# Patient Record
Sex: Female | Born: 1955 | Race: White | Hispanic: No | Marital: Married | Smoking: Current every day smoker
Health system: Southern US, Community
[De-identification: ages and names within clinical notes are randomized; demographics above are authoritative.]

## PROBLEM LIST (undated history)

## (undated) DIAGNOSIS — E079 Disorder of thyroid, unspecified: Secondary | ICD-10-CM

## (undated) DIAGNOSIS — G43909 Migraine, unspecified, not intractable, without status migrainosus: Secondary | ICD-10-CM

## (undated) DIAGNOSIS — M549 Dorsalgia, unspecified: Secondary | ICD-10-CM

## (undated) DIAGNOSIS — F4310 Post-traumatic stress disorder, unspecified: Secondary | ICD-10-CM

## (undated) DIAGNOSIS — F32A Depression, unspecified: Secondary | ICD-10-CM

## (undated) DIAGNOSIS — F329 Major depressive disorder, single episode, unspecified: Secondary | ICD-10-CM

## (undated) DIAGNOSIS — G8929 Other chronic pain: Secondary | ICD-10-CM

## (undated) HISTORY — PX: CATARACT EXTRACTION: SUR2

## (undated) HISTORY — PX: ABDOMINAL HYSTERECTOMY: SHX81

---

## 1999-04-11 ENCOUNTER — Encounter: Payer: Self-pay | Admitting: Obstetrics and Gynecology

## 1999-04-11 ENCOUNTER — Other Ambulatory Visit: Admission: RE | Admit: 1999-04-11 | Discharge: 1999-04-11 | Payer: Self-pay | Admitting: Obstetrics and Gynecology

## 1999-04-11 ENCOUNTER — Ambulatory Visit (HOSPITAL_COMMUNITY): Admission: RE | Admit: 1999-04-11 | Discharge: 1999-04-11 | Payer: Self-pay | Admitting: Obstetrics and Gynecology

## 1999-06-06 ENCOUNTER — Encounter: Payer: Self-pay | Admitting: Obstetrics & Gynecology

## 1999-06-06 ENCOUNTER — Ambulatory Visit (HOSPITAL_COMMUNITY): Admission: RE | Admit: 1999-06-06 | Discharge: 1999-06-06 | Payer: Self-pay | Admitting: Obstetrics & Gynecology

## 2000-04-04 ENCOUNTER — Emergency Department (HOSPITAL_COMMUNITY): Admission: EM | Admit: 2000-04-04 | Discharge: 2000-04-04 | Payer: Self-pay | Admitting: Emergency Medicine

## 2001-03-01 ENCOUNTER — Other Ambulatory Visit: Admission: RE | Admit: 2001-03-01 | Discharge: 2001-03-01 | Payer: Self-pay | Admitting: Obstetrics and Gynecology

## 2001-03-01 ENCOUNTER — Encounter: Payer: Self-pay | Admitting: Obstetrics and Gynecology

## 2001-03-01 ENCOUNTER — Ambulatory Visit (HOSPITAL_COMMUNITY)
Admission: RE | Admit: 2001-03-01 | Discharge: 2001-03-01 | Payer: Self-pay | Admitting: Physical Medicine & Rehabilitation

## 2003-02-22 ENCOUNTER — Ambulatory Visit (HOSPITAL_COMMUNITY): Admission: RE | Admit: 2003-02-22 | Discharge: 2003-02-22 | Payer: Self-pay | Admitting: Gastroenterology

## 2003-03-08 ENCOUNTER — Ambulatory Visit (HOSPITAL_COMMUNITY): Admission: RE | Admit: 2003-03-08 | Discharge: 2003-03-08 | Payer: Self-pay | Admitting: Obstetrics and Gynecology

## 2003-03-08 ENCOUNTER — Encounter: Payer: Self-pay | Admitting: Obstetrics and Gynecology

## 2003-03-09 ENCOUNTER — Encounter: Payer: Self-pay | Admitting: Obstetrics and Gynecology

## 2003-03-09 ENCOUNTER — Ambulatory Visit (HOSPITAL_COMMUNITY): Admission: RE | Admit: 2003-03-09 | Discharge: 2003-03-09 | Payer: Self-pay | Admitting: Obstetrics and Gynecology

## 2003-06-28 ENCOUNTER — Ambulatory Visit (HOSPITAL_COMMUNITY): Admission: RE | Admit: 2003-06-28 | Discharge: 2003-06-28 | Payer: Self-pay | Admitting: Obstetrics and Gynecology

## 2004-03-07 ENCOUNTER — Other Ambulatory Visit: Admission: RE | Admit: 2004-03-07 | Discharge: 2004-03-07 | Payer: Self-pay | Admitting: Obstetrics and Gynecology

## 2004-04-18 ENCOUNTER — Ambulatory Visit (HOSPITAL_COMMUNITY): Admission: RE | Admit: 2004-04-18 | Discharge: 2004-04-18 | Payer: Self-pay | Admitting: Obstetrics and Gynecology

## 2004-11-18 ENCOUNTER — Encounter: Admission: RE | Admit: 2004-11-18 | Discharge: 2004-11-18 | Payer: Self-pay | Admitting: Family Medicine

## 2004-11-20 ENCOUNTER — Encounter: Admission: RE | Admit: 2004-11-20 | Discharge: 2004-11-20 | Payer: Self-pay | Admitting: Family Medicine

## 2004-12-13 ENCOUNTER — Encounter: Admission: RE | Admit: 2004-12-13 | Discharge: 2004-12-13 | Payer: Self-pay | Admitting: Family Medicine

## 2005-03-11 ENCOUNTER — Other Ambulatory Visit: Admission: RE | Admit: 2005-03-11 | Discharge: 2005-03-11 | Payer: Self-pay | Admitting: Obstetrics and Gynecology

## 2005-10-29 ENCOUNTER — Ambulatory Visit (HOSPITAL_COMMUNITY): Admission: RE | Admit: 2005-10-29 | Discharge: 2005-10-29 | Payer: Self-pay | Admitting: Obstetrics and Gynecology

## 2008-09-20 ENCOUNTER — Ambulatory Visit (HOSPITAL_COMMUNITY): Admission: RE | Admit: 2008-09-20 | Discharge: 2008-09-20 | Payer: Self-pay | Admitting: Family Medicine

## 2009-05-13 ENCOUNTER — Emergency Department (HOSPITAL_COMMUNITY): Admission: EM | Admit: 2009-05-13 | Discharge: 2009-05-13 | Payer: Self-pay | Admitting: Emergency Medicine

## 2009-05-28 ENCOUNTER — Encounter: Admission: RE | Admit: 2009-05-28 | Discharge: 2009-05-28 | Payer: Self-pay | Admitting: Family Medicine

## 2009-11-08 ENCOUNTER — Other Ambulatory Visit: Admission: RE | Admit: 2009-11-08 | Discharge: 2009-11-08 | Payer: Self-pay | Admitting: Obstetrics and Gynecology

## 2011-01-10 NOTE — Op Note (Signed)
Kelli Vasquez, Kelli Vasquez                        ACCOUNT NO.:  [id number]   MEDICAL RECORD NO.:  [id number]                   PATIENT TYPE:  AMB   LOCATION:  ENDO                                 FACILITY:  [facility]   PHYSICIAN:  Anselmo Rod, M.D.               DATE OF BIRTH:  1956/05/20   DATE OF PROCEDURE:  02/22/2003  DATE OF DISCHARGE:                                 OPERATIVE REPORT   PROCEDURE:  Screening colonoscopy.   ENDOSCOPIST:  Anselmo Rod, M.D.   INSTRUMENT USED:  Pediatric adjustable Olympus colonoscope changing to an  adult scope.   INDICATIONS FOR PROCEDURE:  The patient is a 55 year old white female with a  history of guaiac positive stool and a questionable family history of colon  cancer in her mother.  Rule out colon polyps, masses, etc.   PRE-PROCEDURE PREPARATION:  Informed consent was procured from the patient.  The patient fasted for eight hours prior to the procedure and prepped with a  bottle of magnesium citrate and a gallon of GoLYTELY and the patient was  given 400 mg of Cipro prophylactically for MVP the night prior to the  procedure. There are known allergies to erythromycin and amoxicillin.   PRE-PROCEDURE PHYSICAL:  VITAL SIGNS:  The patient had stable vital signs.  NECK:  Supple.  CHEST:  Clear to auscultation.  CARDIAC:  S1 and S2 regular.  ABDOMEN:  Abdomen is soft with normal bowel sounds.   DESCRIPTION OF PROCEDURE:  The patient was placed in the left lateral  decubitus position and sedated with 125 mg of Demerol and 12 mg of Versed  intravenously.  Once the patient was adequately sedated and maintained on  low flow oxygen and continuous cardiac monitoring the Olympus video  colonoscope was advanced from the rectum to the cecum with difficulty.  The  pediatric colonoscope had to be withdrawn around the mid transverse colon  because of a very tortuous colon associated with severe visceral  hypersensitivity.  Note on insufflation of the  air into the colon no masses,  polyps, erosions, ulcerations or diverticula were noted.  The appendiceal  orifice and ileocecal valve were visualized and photographed.  The TI was  not visualized.  Small internal hemorrhoids were seen on retroflexion in the  rectum.  The patient tolerated the procedure well without complications.   IMPRESSION:  1. Small, non-bleeding internal hemorrhoids.  2. No masses or polyps seen.  3. Visualization was poor requiring the patient's position to be changed     from left lateral to supine and right lateral position and changing the     pediatric scope to an adult scope.   RECOMMENDATIONS:  1. High fiber diet with liberal fluid intake has been recommended.  2. A 20 to 25 gram fiber diet has been recommended along with liberal fluid     intake.  3.     Repeat colorectal cancer  screening is recommended at the age of 69 unless     the patient develops any abnormal symptoms.  4. Outpatient follow-up in the next two weeks or earlier if need be.                                               Anselmo Rod, M.D.    JNM/MEDQ  D:  02/22/2003  T:  02/22/2003  Job:  [id number]   cc:   Artist Pais, M.D.  [address], [address]  [place]  [place] [address]  Fax: [phone]   Stacie Acres. White, M.D.  [address]  [place]  [place] [address]  Fax: [phone]

## 2011-05-20 ENCOUNTER — Other Ambulatory Visit (HOSPITAL_COMMUNITY): Payer: Self-pay | Admitting: Family Medicine

## 2011-05-20 DIAGNOSIS — Z1231 Encounter for screening mammogram for malignant neoplasm of breast: Secondary | ICD-10-CM

## 2011-05-30 ENCOUNTER — Ambulatory Visit (HOSPITAL_COMMUNITY): Payer: Self-pay

## 2011-06-06 ENCOUNTER — Ambulatory Visit (HOSPITAL_COMMUNITY): Payer: Self-pay | Attending: Family Medicine

## 2012-09-15 ENCOUNTER — Emergency Department (HOSPITAL_COMMUNITY): Payer: Medicare Other

## 2012-09-15 ENCOUNTER — Emergency Department (HOSPITAL_COMMUNITY)
Admission: EM | Admit: 2012-09-15 | Discharge: 2012-09-16 | Disposition: A | Discharge status: Home / Self Care | Payer: Medicare Other | Attending: Emergency Medicine | Admitting: Emergency Medicine

## 2012-09-15 ENCOUNTER — Encounter (HOSPITAL_COMMUNITY): Payer: Self-pay | Admitting: Emergency Medicine

## 2012-09-15 DIAGNOSIS — F4310 Post-traumatic stress disorder, unspecified: Secondary | ICD-10-CM | POA: Insufficient documentation

## 2012-09-15 DIAGNOSIS — F329 Major depressive disorder, single episode, unspecified: Secondary | ICD-10-CM | POA: Insufficient documentation

## 2012-09-15 DIAGNOSIS — R1012 Left upper quadrant pain: Secondary | ICD-10-CM | POA: Insufficient documentation

## 2012-09-15 DIAGNOSIS — F17200 Nicotine dependence, unspecified, uncomplicated: Secondary | ICD-10-CM | POA: Insufficient documentation

## 2012-09-15 DIAGNOSIS — Z79899 Other long term (current) drug therapy: Secondary | ICD-10-CM | POA: Insufficient documentation

## 2012-09-15 DIAGNOSIS — K8689 Other specified diseases of pancreas: Secondary | ICD-10-CM | POA: Insufficient documentation

## 2012-09-15 DIAGNOSIS — R110 Nausea: Secondary | ICD-10-CM | POA: Insufficient documentation

## 2012-09-15 DIAGNOSIS — R109 Unspecified abdominal pain: Secondary | ICD-10-CM

## 2012-09-15 DIAGNOSIS — J029 Acute pharyngitis, unspecified: Secondary | ICD-10-CM | POA: Insufficient documentation

## 2012-09-15 DIAGNOSIS — G43909 Migraine, unspecified, not intractable, without status migrainosus: Secondary | ICD-10-CM | POA: Insufficient documentation

## 2012-09-15 DIAGNOSIS — J3489 Other specified disorders of nose and nasal sinuses: Secondary | ICD-10-CM | POA: Insufficient documentation

## 2012-09-15 DIAGNOSIS — Z8659 Personal history of other mental and behavioral disorders: Secondary | ICD-10-CM | POA: Insufficient documentation

## 2012-09-15 DIAGNOSIS — F3289 Other specified depressive episodes: Secondary | ICD-10-CM | POA: Insufficient documentation

## 2012-09-15 DIAGNOSIS — E079 Disorder of thyroid, unspecified: Secondary | ICD-10-CM | POA: Insufficient documentation

## 2012-09-15 HISTORY — DX: Post-traumatic stress disorder, unspecified: F43.10

## 2012-09-15 HISTORY — DX: Disorder of thyroid, unspecified: E07.9

## 2012-09-15 HISTORY — DX: Migraine, unspecified, not intractable, without status migrainosus: G43.909

## 2012-09-15 HISTORY — DX: Major depressive disorder, single episode, unspecified: F32.9

## 2012-09-15 HISTORY — DX: Depression, unspecified: F32.A

## 2012-09-15 LAB — CBC WITH DIFFERENTIAL/PLATELET
Basophils Absolute: 0 10*3/uL (ref 0.0–0.1)
Eosinophils Relative: 1 % (ref 0–5)
HCT: 35.1 % — ABNORMAL LOW (ref 36.0–46.0)
Hemoglobin: 11.7 g/dL — ABNORMAL LOW (ref 12.0–15.0)
MCH: 32.2 pg (ref 26.0–34.0)
MCHC: 33.3 g/dL (ref 30.0–36.0)
MCV: 96.7 fL (ref 78.0–100.0)
WBC: 7 10*3/uL (ref 4.0–10.5)

## 2012-09-15 LAB — COMPREHENSIVE METABOLIC PANEL
ALT: 34 U/L (ref 0–35)
AST: 29 U/L (ref 0–37)
BUN: 13 mg/dL (ref 6–23)
CO2: 29 mEq/L (ref 19–32)
Chloride: 100 mEq/L (ref 96–112)
Creatinine, Ser: 0.79 mg/dL (ref 0.50–1.10)
GFR calc Af Amer: >90 mL/min (ref >90)
Glucose, Bld: 116 mg/dL — ABNORMAL HIGH (ref 70–99)
Potassium: 3.5 mEq/L (ref 3.5–5.1)
Sodium: 138 mEq/L (ref 135–145)
Total Bilirubin: 0.2 mg/dL — ABNORMAL LOW (ref 0.3–1.2)

## 2012-09-15 LAB — POCT I-STAT TROPONIN I: Troponin i, poc: 0 ng/mL (ref 0.00–0.08)

## 2012-09-15 MED ORDER — MORPHINE SULFATE 4 MG/ML IJ SOLN
4.0000 mg | Freq: Once | INTRAMUSCULAR | Status: AC
  Administered 2012-09-15: 4 mg via INTRAVENOUS
  Filled 2012-09-15: qty 1

## 2012-09-15 MED ORDER — ONDANSETRON HCL 4 MG/2ML IJ SOLN
4.0000 mg | Freq: Once | INTRAMUSCULAR | Status: AC
  Administered 2012-09-16: 4 mg via INTRAVENOUS
  Filled 2012-09-15: qty 2

## 2012-09-15 MED ORDER — ONDANSETRON HCL 4 MG/2ML IJ SOLN
4.0000 mg | Freq: Once | INTRAMUSCULAR | Status: AC
  Administered 2012-09-15: 4 mg via INTRAVENOUS
  Filled 2012-09-15: qty 2

## 2012-09-15 MED ORDER — IOHEXOL 300 MG/ML  SOLN
50.0000 mL | Freq: Once | INTRAMUSCULAR | Status: AC | PRN
  Administered 2012-09-15: 50 mL via ORAL

## 2012-09-15 NOTE — ED Provider Notes (Signed)
57 year old female has been treated by her PCP for bronchitis with a Z-Pak. Today, she had an episode where she felt like she couldn't breathe on her left side. She's also been having left-sided abdominal pain. There is no nausea vomiting. She's not had any fever or chills. On exam, lungs are completely clear. Abdomen has moderate tenderness throughout the left side of the abdomen. Chest x-ray is unremarkable. CT of the abdomen is pending.  I saw and evaluated the patient, reviewed the resident's note and I agree with the findings and plan.   Dione Booze, MD 09/15/12 2306

## 2012-09-15 NOTE — ED Provider Notes (Signed)
History     CSN: [id number]  Arrival date & time 09/15/12  2003   First MD Initiated Contact with Patient 09/15/12 2141      Chief Complaint  Patient presents with  . Shortness of Breath    (Consider location/radiation/quality/duration/timing/severity/associated sxs/prior treatment) Patient is a 57 y.o. female presenting with URI and abdominal pain. The history is provided by the patient.  URI The primary symptoms include sore throat, abdominal pain and nausea. Primary symptoms do not include fever, fatigue, headaches, wheezing, vomiting, arthralgias or rash. The current episode started more than 1 week ago (2 months ago). This is a new problem. The problem has been gradually worsening.  The onset of the illness is associated with exposure to sick contacts. Symptoms associated with the illness include facial pain, sinus pressure, congestion and rhinorrhea.  Abdominal Pain The primary symptoms of the illness include abdominal pain and nausea. The primary symptoms of the illness do not include fever, fatigue, shortness of breath, vomiting or diarrhea. The current episode started more than 2 days ago. The onset of the illness was gradual. The problem has been gradually worsening.  The abdominal pain began more than 2 days ago. The pain came on gradually. The abdominal pain has been unchanged since its onset. The abdominal pain is located in the LUQ.  The patient has not had a change in bowel habit. Symptoms associated with the illness do not include urgency, frequency or back pain.    Past Medical History  Diagnosis Date  . Thyroid disease   . Depression   . PTSD (post-traumatic stress disorder)   . Migraine     History reviewed. No pertinent past surgical history.  History reviewed. No pertinent family history.  History  Substance Use Topics  . Smoking status: Current Every Day Smoker -- 0.5 packs/day  . Smokeless tobacco: Not on file  . Alcohol Use: Yes     Comment: Rarely     OB History    Grav Para Term Preterm Abortions TAB SAB Ect Mult Living                  Review of Systems  Constitutional: Negative for fever and fatigue.  HENT: Positive for congestion, sore throat, rhinorrhea and sinus pressure. Negative for postnasal drip.   Eyes: Negative for photophobia and visual disturbance.  Respiratory: Negative for chest tightness, shortness of breath and wheezing.   Cardiovascular: Negative for chest pain, palpitations and leg swelling.  Gastrointestinal: Positive for nausea and abdominal pain. Negative for vomiting and diarrhea.  Genitourinary: Negative for urgency, frequency and difficulty urinating.  Musculoskeletal: Negative for back pain and arthralgias.  Skin: Negative for rash and wound.  Neurological: Negative for weakness and headaches.  Psychiatric/Behavioral: Negative for confusion and agitation.    Allergies  Amoxicillin; Codeine; and Erythromycin  Home Medications   Current Outpatient Rx  Name  Route  Sig  Dispense  Refill  . CYCLOBENZAPRINE HCL 10 MG PO TABS   Oral   Take 10 mg by mouth 3 (three) times daily as needed. For pain         . LEVOTHYROXINE SODIUM 137 MCG PO TABS   Oral   Take 137 mcg by mouth daily.         Marland Kitchen LORAZEPAM 1 MG PO TABS   Oral   Take 1 mg by mouth 4 (four) times daily as needed. For anxiety         . OMEPRAZOLE 40 MG PO CPDR  Oral   Take 40 mg by mouth daily.         . OXYCODONE-ACETAMINOPHEN 10-325 MG PO TABS   Oral   Take 1 tablet by mouth every 6 (six) hours as needed. For pain         . PROMETHAZINE HCL 25 MG PO TABS   Oral   Take 25 mg by mouth every 6 (six) hours as needed. For nausea         . SIMVASTATIN 40 MG PO TABS   Oral   Take 40 mg by mouth every evening.         . TRAZODONE HCL 50 MG PO TABS   Oral   Take 50-100 mg by mouth at bedtime and may repeat dose one time if needed. For insomnia           BP 100/41  Pulse 65  Temp 98.4 F (36.9 C) (Oral)   Resp 16  SpO2 97%  Physical Exam  Nursing note and vitals reviewed. Constitutional: She is oriented to person, place, and time. She appears well-developed and well-nourished. No distress.  HENT:  Head: Normocephalic and atraumatic.  Mouth/Throat: Oropharynx is clear and moist.  Eyes: EOM are normal. Pupils are equal, round, and reactive to light.  Neck: Normal range of motion. Neck supple.  Cardiovascular: Normal rate, regular rhythm, normal heart sounds and intact distal pulses.   Pulmonary/Chest: Effort normal and breath sounds normal. She has no wheezes. She has no rales.  Abdominal: Soft. Bowel sounds are normal. She exhibits no distension. There is tenderness. There is no rebound and no guarding.       Mild left upper quadrant tenderness. No sign of peritonitis  Musculoskeletal: Normal range of motion. She exhibits no edema and no tenderness.  Lymphadenopathy:    She has no cervical adenopathy.  Neurological: She is alert and oriented to person, place, and time. She displays normal reflexes. No cranial nerve deficit. She exhibits normal muscle tone. Coordination normal.  Skin: Skin is warm and dry. No rash noted.  Psychiatric: She has a normal mood and affect. Her behavior is normal.    ED Course  Procedures (including critical care time)  Labs Reviewed  CBC WITH DIFFERENTIAL - Abnormal; Notable for the following:    RBC 3.63 (*)     Hemoglobin 11.7 (*)     HCT 35.1 (*)     All other components within normal limits  COMPREHENSIVE METABOLIC PANEL - Abnormal; Notable for the following:    Glucose, Bld 116 (*)     Total Bilirubin 0.2 (*)     All other components within normal limits  URINALYSIS, ROUTINE W REFLEX MICROSCOPIC - Abnormal; Notable for the following:    Leukocytes, UA SMALL (*)     All other components within normal limits  LIPASE, BLOOD  POCT I-STAT TROPONIN I  URINE MICROSCOPIC-ADD ON  URINE CULTURE   Dg Chest 2 View  09/15/2012  *RADIOLOGY REPORT*   Clinical Data: Shortness of breath.  Cough.  CHEST - 2 VIEW  Comparison: None.  Findings: Heart size and pulmonary vascularity are normal and the lungs are clear except for slight peribronchial thickening consistent with bronchitis.  Multiple calcified granulomas in the right lung and in the right hilum.  No effusions.  No osseous abnormality.  IMPRESSION: Slight bronchitic changes.   Original Report Authenticated By: Francene Boyers, M.D.      No diagnosis found.    MDM  54F here with 2-3 months  of upper respiratory congestion, cough, and left upper quadrant abdominal pain. She reports an unexplained 19 lb weight loss along with these symptoms. She was seen by her pcp about 1 week ago and was put on azithromycin for bronchitis and is going to schedule a colonoscopy soon. Exam as noted above. Does have some nasal congestion. Abdomen mildly tender LUQ. No peritonitis. CXR c/w bronchitis. Labs unremarkable except mild anemia with Hgb 11.7 do not have previous to compare. Will obtain CT abd/pelvis to r/o possible malignancy or serious infection. Given Morphine and Zofran for symptomatic control.  1:24 AM Pt currently in CT. Pt care transferred to Dr. Hyacinth Meeker pending results. If CT scan negative for acute pathology, pt will f/u with pcp for further management of weight loss, pain, and URI symptoms.      Johnnette Gourd, MD 09/16/12 [phone]

## 2012-09-15 NOTE — ED Notes (Addendum)
Patient reports shortness of breath over the last two weeks and left sided abdominal pain.  Placed on Z-Pak 10 days ago for bronchitis; Patient reports that she has finished taking entire prescription and does not feel any better.  Shortness of breath worsened tonight.  Denies chest pain.

## 2012-09-16 ENCOUNTER — Emergency Department (HOSPITAL_COMMUNITY): Payer: Medicare Other

## 2012-09-16 LAB — URINALYSIS, ROUTINE W REFLEX MICROSCOPIC
Glucose, UA: NEGATIVE mg/dL
Ketones, ur: NEGATIVE mg/dL
Nitrite: NEGATIVE
Protein, ur: NEGATIVE mg/dL
Urobilinogen, UA: 0.2 mg/dL (ref 0.0–1.0)

## 2012-09-16 LAB — URINE MICROSCOPIC-ADD ON

## 2012-09-16 MED ORDER — OXYCODONE-ACETAMINOPHEN 5-325 MG PO TABS
2.0000 | ORAL_TABLET | Freq: Once | ORAL | Status: AC
  Administered 2012-09-16: 2 via ORAL
  Filled 2012-09-16: qty 2

## 2012-09-16 MED ORDER — OXYCODONE-ACETAMINOPHEN 5-325 MG PO TABS: 1.0000 | ORAL_TABLET | ORAL | Status: DC | PRN

## 2012-09-16 MED ORDER — ONDANSETRON HCL 4 MG/2ML IJ SOLN
4.0000 mg | Freq: Once | INTRAMUSCULAR | Status: AC
  Administered 2012-09-16: 4 mg via INTRAVENOUS
  Filled 2012-09-16: qty 2

## 2012-09-16 MED ORDER — IOHEXOL 300 MG/ML  SOLN
100.0000 mL | Freq: Once | INTRAMUSCULAR | Status: AC | PRN
  Administered 2012-09-16: 80 mL via INTRAVENOUS

## 2012-09-16 NOTE — ED Notes (Signed)
Pt comfortable with discharge instructions and verbalized understanding of follow-up care

## 2012-09-16 NOTE — ED Notes (Addendum)
Pt returned from CT. Continues to c/o nausea, EDP aware.

## 2012-09-16 NOTE — ED Provider Notes (Signed)
  Physical Exam  BP 93/61  Pulse 61  Temp 98.1 F (36.7 C) (Oral)  Resp 16  SpO2 96%  Physical Exam  ED Course  Procedures  MDM Patient reexamined, soft abdomen, minimal tenderness, labs reviewed, CT scan reviewed, patient has expressed her understanding and will followup with her family Dr. I have given her a printout of all of her results so that she can followup this week. She has expressed her understanding of all of her diagnosis based on CT scan results. At this time she appears stable for discharge.      Vida Roller, MD 09/16/12 [phone]

## 2012-09-16 NOTE — ED Notes (Signed)
Pt transported to CT ?

## 2012-09-17 LAB — URINE CULTURE
Colony Count: NO GROWTH
Culture: NO GROWTH

## 2013-06-13 ENCOUNTER — Emergency Department (HOSPITAL_COMMUNITY)
Admission: EM | Admit: 2013-06-13 | Discharge: 2013-06-14 | Disposition: A | Discharge status: Home / Self Care | Payer: Medicare Other | Attending: Emergency Medicine | Admitting: Emergency Medicine

## 2013-06-13 ENCOUNTER — Encounter (HOSPITAL_COMMUNITY): Payer: Self-pay | Admitting: Emergency Medicine

## 2013-06-13 DIAGNOSIS — F17200 Nicotine dependence, unspecified, uncomplicated: Secondary | ICD-10-CM | POA: Insufficient documentation

## 2013-06-13 DIAGNOSIS — M545 Low back pain, unspecified: Secondary | ICD-10-CM | POA: Insufficient documentation

## 2013-06-13 DIAGNOSIS — G8929 Other chronic pain: Secondary | ICD-10-CM | POA: Insufficient documentation

## 2013-06-13 DIAGNOSIS — Z79899 Other long term (current) drug therapy: Secondary | ICD-10-CM | POA: Insufficient documentation

## 2013-06-13 DIAGNOSIS — G43909 Migraine, unspecified, not intractable, without status migrainosus: Secondary | ICD-10-CM | POA: Insufficient documentation

## 2013-06-13 DIAGNOSIS — F329 Major depressive disorder, single episode, unspecified: Secondary | ICD-10-CM | POA: Insufficient documentation

## 2013-06-13 DIAGNOSIS — F4310 Post-traumatic stress disorder, unspecified: Secondary | ICD-10-CM | POA: Insufficient documentation

## 2013-06-13 DIAGNOSIS — R52 Pain, unspecified: Secondary | ICD-10-CM | POA: Insufficient documentation

## 2013-06-13 DIAGNOSIS — R209 Unspecified disturbances of skin sensation: Secondary | ICD-10-CM | POA: Insufficient documentation

## 2013-06-13 DIAGNOSIS — E079 Disorder of thyroid, unspecified: Secondary | ICD-10-CM | POA: Insufficient documentation

## 2013-06-13 DIAGNOSIS — F3289 Other specified depressive episodes: Secondary | ICD-10-CM | POA: Insufficient documentation

## 2013-06-13 HISTORY — DX: Other chronic pain: G89.29

## 2013-06-13 HISTORY — DX: Dorsalgia, unspecified: M54.9

## 2013-06-13 MED ORDER — HYDROMORPHONE HCL PF 1 MG/ML IJ SOLN
1.0000 mg | Freq: Once | INTRAMUSCULAR | Status: AC
  Administered 2013-06-13: 1 mg via INTRAMUSCULAR
  Filled 2013-06-13: qty 1

## 2013-06-13 NOTE — ED Provider Notes (Signed)
CSN: [id number]     Arrival date & time 06/13/13  2026 History   First MD Initiated Contact with Patient 06/13/13 2154     Chief Complaint  Patient presents with  . Medication Reaction  . Back Pain   (Consider location/radiation/quality/duration/timing/severity/associated sxs/prior Treatment) Patient is a 57 y.o. female presenting with back pain. The history is provided by the patient and medical records.  Back Pain Location:  Lumbar spine Quality:  Aching and shooting Radiates to:  L posterior upper leg, L knee and L foot Pain severity:  Severe Pain is:  Same all the time Onset quality:  Gradual Timing:  Intermittent Progression:  Worsening Chronicity:  Chronic Context: twisting   Relieved by:  Narcotics and muscle relaxants Worsened by:  Ambulation, bending, movement and twisting Associated symptoms: numbness (stated in left leg)   Associated symptoms: no abdominal pain, no bladder incontinence, no bowel incontinence, no chest pain, no dysuria, no fever, no headaches, no paresthesias, no perianal numbness, no tingling and no weakness     Past Medical History  Diagnosis Date  . Thyroid disease   . Depression   . PTSD (post-traumatic stress disorder)   . Migraine   . Chronic back pain    Past Surgical History  Procedure Laterality Date  . Abdominal hysterectomy    . Cataract extraction     No family history on file. History  Substance Use Topics  . Smoking status: Current Every Day Smoker -- 0.50 packs/day  . Smokeless tobacco: Not on file  . Alcohol Use: Yes     Comment: Rarely   OB History   Grav Para Term Preterm Abortions TAB SAB Ect Mult Living                 Review of Systems  Constitutional: Negative for fever and chills.  Respiratory: Negative for chest tightness and shortness of breath.   Cardiovascular: Negative for chest pain.  Gastrointestinal: Negative for nausea, vomiting, abdominal pain, diarrhea, constipation and bowel incontinence.   Genitourinary: Negative for bladder incontinence, dysuria, urgency, flank pain, decreased urine volume and difficulty urinating.  Musculoskeletal: Positive for back pain. Negative for arthralgias, gait problem, myalgias and neck pain.  Skin: Negative for color change, pallor, rash and wound.  Neurological: Positive for numbness (stated in left leg). Negative for dizziness, tingling, weakness, light-headedness, headaches and paresthesias.  All other systems reviewed and are negative.    Allergies  Amoxicillin; Codeine; and Erythromycin  Home Medications   Current Outpatient Rx  Name  Route  Sig  Dispense  Refill  . cyclobenzaprine (FLEXERIL) 10 MG tablet   Oral   Take 10 mg by mouth 3 (three) times daily as needed for muscle spasms.          Marland Kitchen levothyroxine (SYNTHROID, LEVOTHROID) 137 MCG tablet   Oral   Take 137 mcg by mouth daily.         Marland Kitchen LORazepam (ATIVAN) 1 MG tablet   Oral   Take 1 mg by mouth 4 (four) times daily as needed for anxiety.          Marland Kitchen omeprazole (PRILOSEC) 40 MG capsule   Oral   Take 40 mg by mouth daily.         . Oxycodone HCl 10 MG TABS   Oral   Take 10 mg by mouth every 6 (six) hours as needed (pain).          . promethazine (PHENERGAN) 25 MG tablet   Oral  Take 25 mg by mouth every 6 (six) hours as needed for nausea.          . sertraline (ZOLOFT) 100 MG tablet   Oral   Take 200 mg by mouth daily.         . simvastatin (ZOCOR) 40 MG tablet   Oral   Take 40 mg by mouth every evening.         . traZODone (DESYREL) 50 MG tablet   Oral   Take 50-100 mg by mouth at bedtime as needed for sleep.           BP 105/48  Pulse 72  Temp(Src) 99 F (37.2 C) (Oral)  Resp 17  Ht 5' 7.5" (1.715 m)  Wt 122 lb (55.339 kg)  BMI 18.81 kg/m2  SpO2 95% Physical Exam  Nursing note and vitals reviewed. Constitutional: She is oriented to person, place, and time. She appears well-developed and well-nourished. She appears distressed (in  mild discomfort).  HENT:  Head: Normocephalic and atraumatic.  Mouth/Throat: Oropharynx is clear and moist. No oropharyngeal exudate.  Eyes: Conjunctivae and EOM are normal. Pupils are equal, round, and reactive to light.  Neck: Normal range of motion. Neck supple.  Cardiovascular: Normal rate, regular rhythm and normal heart sounds.  Exam reveals no gallop and no friction rub.   No murmur heard. Pulmonary/Chest: Effort normal and breath sounds normal. No respiratory distress. She has no wheezes. She has no rales. She exhibits no tenderness.  Abdominal: Soft. She exhibits no distension. There is no tenderness.  Genitourinary:  No peri-anal numbness  Musculoskeletal:  Positive straight leg test on left. No midline lumbar tenderness. Pain to palpation of left para-spinal area and left gluteal region.  Lymphadenopathy:    She has no cervical adenopathy.  Neurological: She is alert and oriented to person, place, and time. She has normal strength. No cranial nerve deficit or sensory deficit. She displays a negative Romberg sign. Coordination and gait normal. GCS eye subscore is 4. GCS verbal subscore is 5. GCS motor subscore is 6. She displays no Babinski's sign on the right side. She displays no Babinski's sign on the left side.  Reflex Scores:      Patellar reflexes are 2+ on the right side and 2+ on the left side.      Achilles reflexes are 2+ on the right side and 2+ on the left side. 5/5 strength in bilateral lower extremities. Intact motor and sensory function.  Skin: Skin is warm and dry. No rash noted. She is not diaphoretic.  Psychiatric: She has a normal mood and affect. Her behavior is normal. Judgment and thought content normal.    ED Course  Procedures (including critical care time) Labs Review Labs Reviewed - No data to display Imaging Review No results found.  EKG Interpretation   None       MDM   1. Acute exacerbation of chronic low back pain     57 year old  female with a history of chronic lower back pain presents with acute exacerbation of her lumbar back pain as well as an episode of bilateral arm spasming and numbness. Patient states that this past weekend she was wrestling with her grandson at which point she felt a twinge in her back. Since that time her back pain is been getting increasingly worse and has been/responsive to her chronic oxycodone use. Today, despite 20 mg of oxycodone, 20 mg of Flexeril, 4 mg of Ativan, she had increasing back pain as well  as tightness and clenching of her bilateral hands. She also described radiating pain down to her left leg and her left foot. No history of trauma aside from this wrestling event. She did not fall or do any heavy lifting.  On exam, patient has good strength throughout and normal sensory and motor function. 2+ patellar, Achilles, biceps reflexes. No perianal numbness. No history of bowel or bladder loss. Afebrile. Symptoms that could have been concerning for dystonic reaction have resolved prior to arrival with Benadryl. Only complaint now is pain which appears similar to previous chronic back pain. Based on history and exam, very low concern for traumatic injury, vertebral fracture, ruptured disc, cauda equina. Pain treated in the emergency department and significantly improved following one shot of IM narcotics. Neurologic exam remains nonfocal on repeat exam. Patient ambulated with assistance prior to discharge. She states that she will followup with her primary care Dr. this week.  Discussed with the patient return precautions and need for follow up with PCP. Patient voiced understanding. Stable for d/c. This patient was discussed with my attending, Dr. Oletta Lamas.   Dorna Leitz, MD 06/14/13 [phone]

## 2013-06-13 NOTE — ED Notes (Signed)
Patient presents to ED via [facility]. Per EMS patient was having severe back pain for the past several days. Pt family gave patient 20mg  oxycodone, 20mg  flexeril and 2mg  lorazepam. Pt presents to ED with arms slightly drawn inward and hands clinched. Pt denies any numbness/ tingling. Pt c/o of "itching all over." Pt c/o of back pain and bilateral leg cramps. EMS gave pt 25 mg of Benadryl in route. Upon arrival to ED pt is A&Ox4.

## 2013-06-14 MED ORDER — ONDANSETRON HCL 4 MG/2ML IJ SOLN
4.0000 mg | Freq: Once | INTRAMUSCULAR | Status: AC
  Administered 2013-06-14: 4 mg via INTRAVENOUS
  Filled 2013-06-14: qty 2
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
## 2014-04-26 ENCOUNTER — Other Ambulatory Visit (HOSPITAL_COMMUNITY): Payer: Self-pay | Admitting: Family Medicine

## 2014-04-26 DIAGNOSIS — Z1231 Encounter for screening mammogram for malignant neoplasm of breast: Secondary | ICD-10-CM

## 2014-05-03 ENCOUNTER — Other Ambulatory Visit (HOSPITAL_COMMUNITY): Payer: Self-pay | Admitting: Family Medicine

## 2014-05-03 DIAGNOSIS — N951 Menopausal and female climacteric states: Secondary | ICD-10-CM

## 2014-06-01 ENCOUNTER — Ambulatory Visit (HOSPITAL_COMMUNITY): Payer: Medicare Other

## 2014-06-23 ENCOUNTER — Ambulatory Visit (HOSPITAL_COMMUNITY): Payer: Medicare Other

## 2014-07-26 ENCOUNTER — Ambulatory Visit (HOSPITAL_COMMUNITY): Payer: Medicare Other

## 2015-01-20 ENCOUNTER — Emergency Department (INDEPENDENT_AMBULATORY_CARE_PROVIDER_SITE_OTHER): Payer: Medicare Other

## 2015-01-20 ENCOUNTER — Emergency Department (HOSPITAL_COMMUNITY)
Admission: EM | Admit: 2015-01-20 | Discharge: 2015-01-20 | Disposition: A | Discharge status: Home / Self Care | Payer: Medicare Other | Source: Home / Self Care | Attending: Family Medicine | Admitting: Family Medicine

## 2015-01-20 ENCOUNTER — Encounter (HOSPITAL_COMMUNITY): Payer: Self-pay | Admitting: Emergency Medicine

## 2015-01-20 DIAGNOSIS — Z23 Encounter for immunization: Secondary | ICD-10-CM | POA: Diagnosis not present

## 2015-01-20 DIAGNOSIS — S9002XA Contusion of left ankle, initial encounter: Secondary | ICD-10-CM

## 2015-01-20 DIAGNOSIS — T148 Other injury of unspecified body region: Secondary | ICD-10-CM | POA: Diagnosis not present

## 2015-01-20 DIAGNOSIS — T148XXA Other injury of unspecified body region, initial encounter: Secondary | ICD-10-CM

## 2015-01-20 MED ORDER — TETANUS-DIPHTH-ACELL PERTUSSIS 5-2.5-18.5 LF-MCG/0.5 IM SUSP
0.5000 mL | Freq: Once | INTRAMUSCULAR | Status: AC
  Administered 2015-01-20: 0.5 mL via INTRAMUSCULAR

## 2015-01-20 MED ORDER — BACITRACIN ZINC 500 UNIT/GM EX OINT
TOPICAL_OINTMENT | CUTANEOUS | Status: AC
  Filled 2015-01-20: qty 0.9

## 2015-01-20 MED ORDER — BACITRACIN 500 UNIT/GM EX OINT: 1.0000 "application " | TOPICAL_OINTMENT | Freq: Two times a day (BID) | CUTANEOUS | Status: DC

## 2015-01-20 MED ORDER — TETANUS-DIPHTH-ACELL PERTUSSIS 5-2.5-18.5 LF-MCG/0.5 IM SUSP
INTRAMUSCULAR | Status: AC
  Filled 2015-01-20: qty 0.5
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
## 2015-01-20 NOTE — Discharge Instructions (Signed)
Contusion A contusion is a deep bruise. Contusions happen when an injury causes bleeding under the skin. Signs of bruising include pain, puffiness (swelling), and discolored skin. The contusion may turn blue, purple, or yellow. HOME CARE   Put ice on the injured area.  Put ice in a plastic bag.  Place a towel between your skin and the bag.  Leave the ice on for 15-20 minutes, 03-04 times a day.  Only take medicine as told by your doctor.  Rest the injured area.  If possible, raise (elevate) the injured area to lessen puffiness. GET HELP RIGHT AWAY IF:   You have more bruising or puffiness.  You have pain that is getting worse.  Your puffiness or pain is not helped by medicine. MAKE SURE YOU:   Understand these instructions.  Will watch your condition.  Will get help right away if you are not doing well or get worse. Document Released: 01/28/2008 Document Revised: 11/03/2011 Document Reviewed: 06/16/2011 [facility] Patient Information 2015 [place], [place]. This information is not intended to replace advice given to you by your health care provider. Make sure you discuss any questions you have with your health care provider.  Abrasion An abrasion is a cut or scrape of the skin. Abrasions do not extend through all layers of the skin and most heal within 10 days. It is important to care for your abrasion properly to prevent infection. CAUSES  Most abrasions are caused by falling on, or gliding across, the ground or other surface. When your skin rubs on something, the outer and inner layer of skin rubs off, causing an abrasion. DIAGNOSIS  Your caregiver will be able to diagnose an abrasion during a physical exam.  TREATMENT  Your treatment depends on how large and deep the abrasion is. Generally, your abrasion will be cleaned with water and a mild soap to remove any dirt or debris. An antibiotic ointment may be put over the abrasion to prevent an infection. A bandage (dressing) may  be wrapped around the abrasion to keep it from getting dirty.  You may need a tetanus shot if:  You cannot remember when you had your last tetanus shot.  You have never had a tetanus shot.  The injury broke your skin. If you get a tetanus shot, your arm may swell, get red, and feel warm to the touch. This is common and not a problem. If you need a tetanus shot and you choose not to have one, there is a rare chance of getting tetanus. Sickness from tetanus can be serious.  HOME CARE INSTRUCTIONS   If a dressing was applied, change it at least once a day or as directed by your caregiver. If the bandage sticks, soak it off with warm water.   Wash the area with water and a mild soap to remove all the ointment 2 times a day. Rinse off the soap and pat the area dry with a clean towel.   Reapply any ointment as directed by your caregiver. This will help prevent infection and keep the bandage from sticking. Use gauze over the wound and under the dressing to help keep the bandage from sticking.   Change your dressing right away if it becomes wet or dirty.   Only take over-the-counter or prescription medicines for pain, discomfort, or fever as directed by your caregiver.   Follow up with your caregiver within 24-48 hours for a wound check, or as directed. If you were not given a wound-check appointment, look closely  at your abrasion for redness, swelling, or pus. These are signs of infection. SEEK IMMEDIATE MEDICAL CARE IF:   You have increasing pain in the wound.   You have redness, swelling, or tenderness around the wound.   You have pus coming from the wound.   You have a fever or persistent symptoms for more than 2-3 days.  You have a fever and your symptoms suddenly get worse.  You have a bad smell coming from the wound or dressing.  MAKE SURE YOU:   Understand these instructions.  Will watch your condition.  Will get help right away if you are not doing well or get  worse. Document Released: 05/21/2005 Document Revised: 07/28/2012 Document Reviewed: 07/15/2011 [facility] Patient Information 2015 [place], [place]. This information is not intended to replace advice given to you by your health care provider. Make sure you discuss any questions you have with your health care provider.
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
## 2015-01-25 ENCOUNTER — Ambulatory Visit (HOSPITAL_COMMUNITY)
Admission: RE | Admit: 2015-01-25 | Discharge: 2015-01-25 | Disposition: A | Discharge status: Home / Self Care | Payer: Medicare Other | Source: Ambulatory Visit | Attending: Family Medicine | Admitting: Family Medicine

## 2015-01-25 ENCOUNTER — Other Ambulatory Visit (HOSPITAL_COMMUNITY): Payer: Self-pay | Admitting: Family Medicine

## 2015-01-25 DIAGNOSIS — Z780 Asymptomatic menopausal state: Secondary | ICD-10-CM | POA: Insufficient documentation

## 2015-01-25 DIAGNOSIS — Z13820 Encounter for screening for osteoporosis: Secondary | ICD-10-CM | POA: Diagnosis not present

## 2015-01-25 DIAGNOSIS — Z1231 Encounter for screening mammogram for malignant neoplasm of breast: Secondary | ICD-10-CM

## 2015-01-25 DIAGNOSIS — N951 Menopausal and female climacteric states: Secondary | ICD-10-CM

## 2017-03-19 ENCOUNTER — Other Ambulatory Visit: Payer: Self-pay | Admitting: Family Medicine

## 2017-03-19 DIAGNOSIS — M810 Age-related osteoporosis without current pathological fracture: Secondary | ICD-10-CM

## 2017-05-28 ENCOUNTER — Ambulatory Visit
Admission: RE | Admit: 2017-05-28 | Discharge: 2017-05-28 | Disposition: A | Discharge status: Home / Self Care | Payer: Medicare Other | Source: Ambulatory Visit | Attending: Family Medicine | Admitting: Family Medicine

## 2017-05-28 DIAGNOSIS — M810 Age-related osteoporosis without current pathological fracture: Secondary | ICD-10-CM

## 2017-09-23 ENCOUNTER — Other Ambulatory Visit: Payer: Self-pay | Admitting: Family Medicine

## 2017-09-23 ENCOUNTER — Ambulatory Visit
Admission: RE | Admit: 2017-09-23 | Discharge: 2017-09-23 | Disposition: A | Discharge status: Home / Self Care | Payer: Medicare Other | Source: Ambulatory Visit | Attending: Family Medicine | Admitting: Family Medicine

## 2017-09-23 DIAGNOSIS — J690 Pneumonitis due to inhalation of food and vomit: Secondary | ICD-10-CM

## 2019-11-23 ENCOUNTER — Other Ambulatory Visit: Payer: Self-pay | Admitting: Family Medicine

## 2019-11-23 DIAGNOSIS — M810 Age-related osteoporosis without current pathological fracture: Secondary | ICD-10-CM

## 2019-11-23 DIAGNOSIS — Z1231 Encounter for screening mammogram for malignant neoplasm of breast: Secondary | ICD-10-CM

## 2020-02-17 ENCOUNTER — Ambulatory Visit
Admission: RE | Admit: 2020-02-17 | Discharge: 2020-02-17 | Disposition: A | Discharge status: Home / Self Care | Payer: Medicare Other | Source: Ambulatory Visit | Attending: Family Medicine | Admitting: Family Medicine

## 2020-02-17 ENCOUNTER — Other Ambulatory Visit: Payer: Self-pay

## 2020-02-17 DIAGNOSIS — Z1231 Encounter for screening mammogram for malignant neoplasm of breast: Secondary | ICD-10-CM

## 2020-02-17 DIAGNOSIS — M810 Age-related osteoporosis without current pathological fracture: Secondary | ICD-10-CM

## 2020-08-30 IMAGING — MG DIGITAL SCREENING BILAT W/ TOMO W/ CAD
6 of 10 series · 6 of 30 positions shown · non-contrast
Comparison: Previous exam(s).

CLINICAL DATA: Screening.

EXAM:
DIGITAL SCREENING BILATERAL MAMMOGRAM WITH TOMO AND CAD

[R MLO synth-2D]
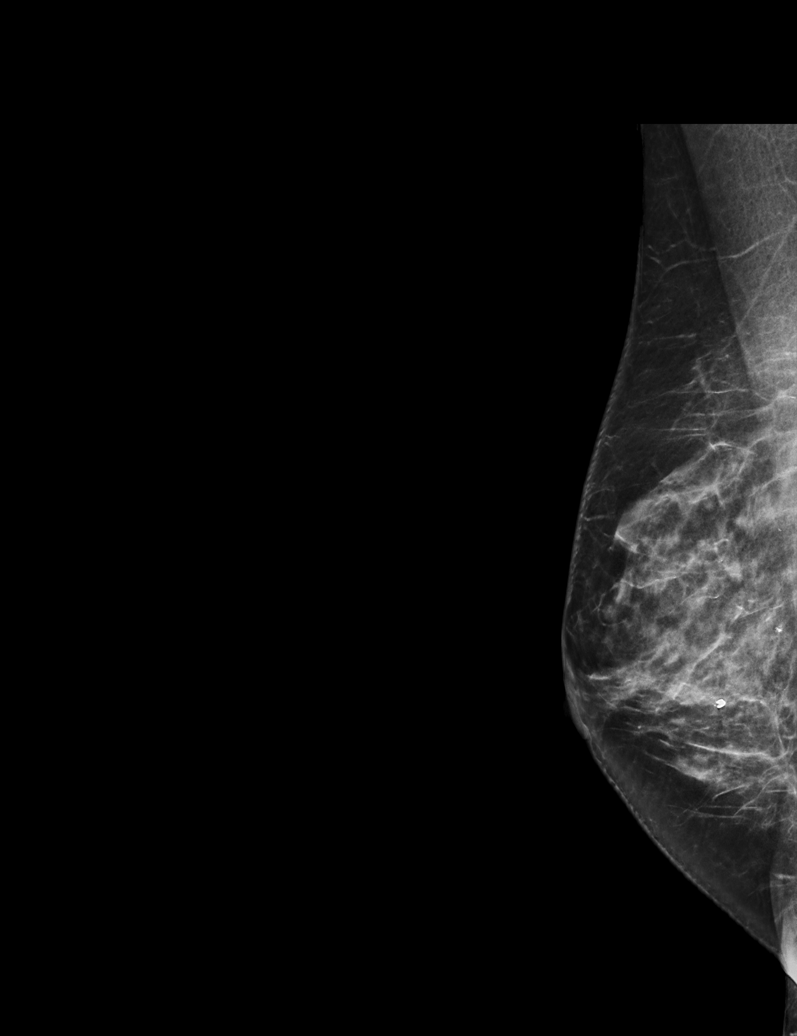

[R CC synth-2D (1 of 2)]
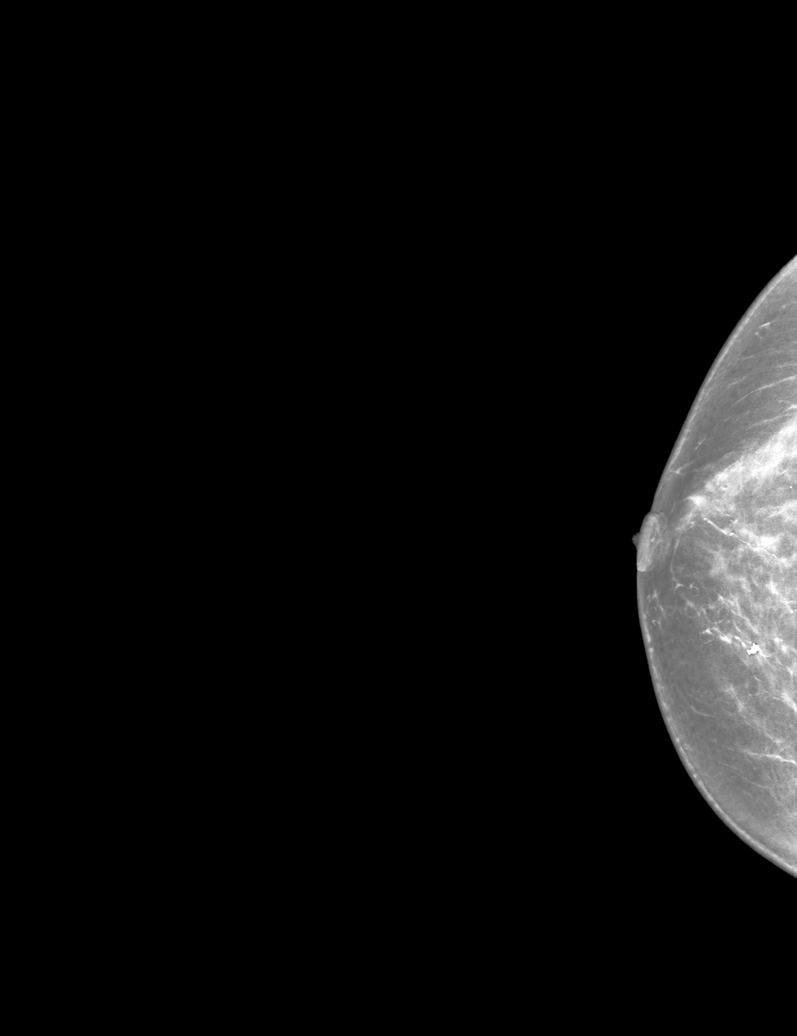

[L MLO synth-2D]
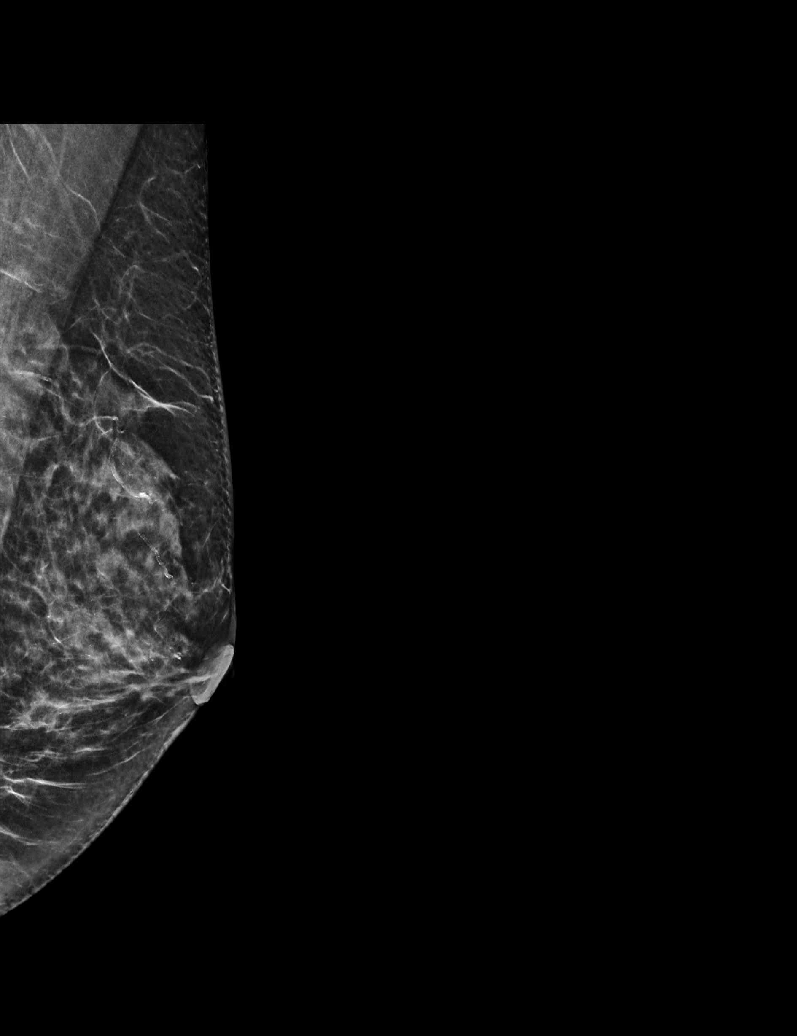

[R CC synth-2D (2 of 2)]
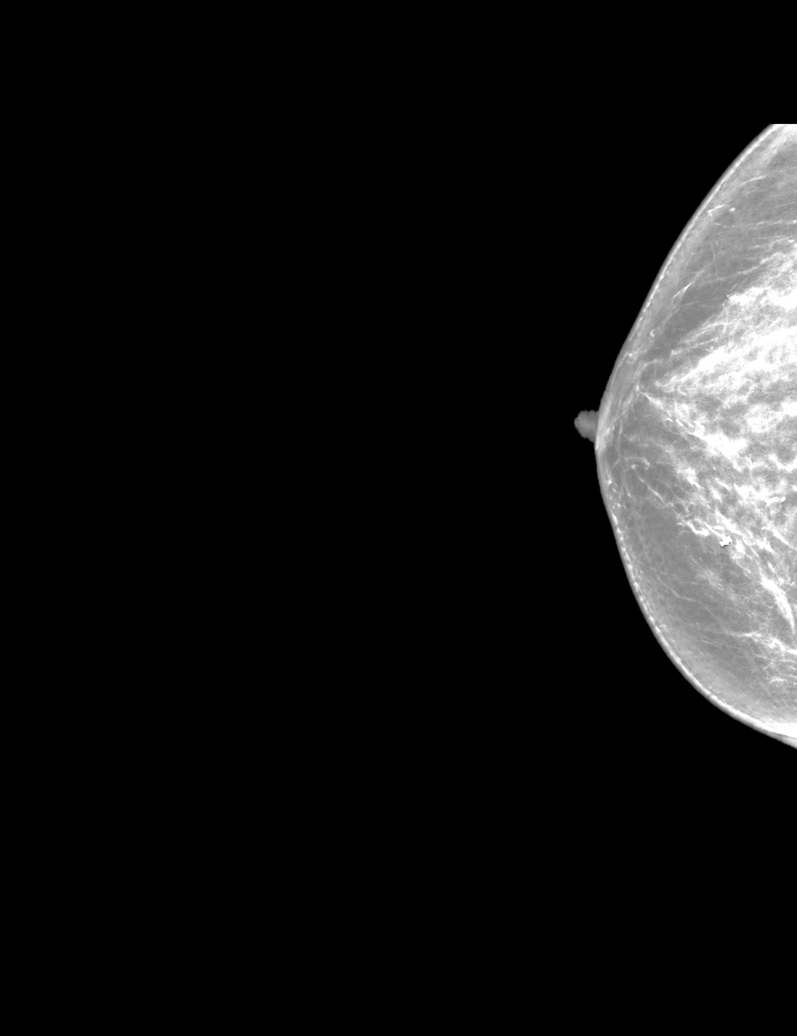

[L CC synth-2D]
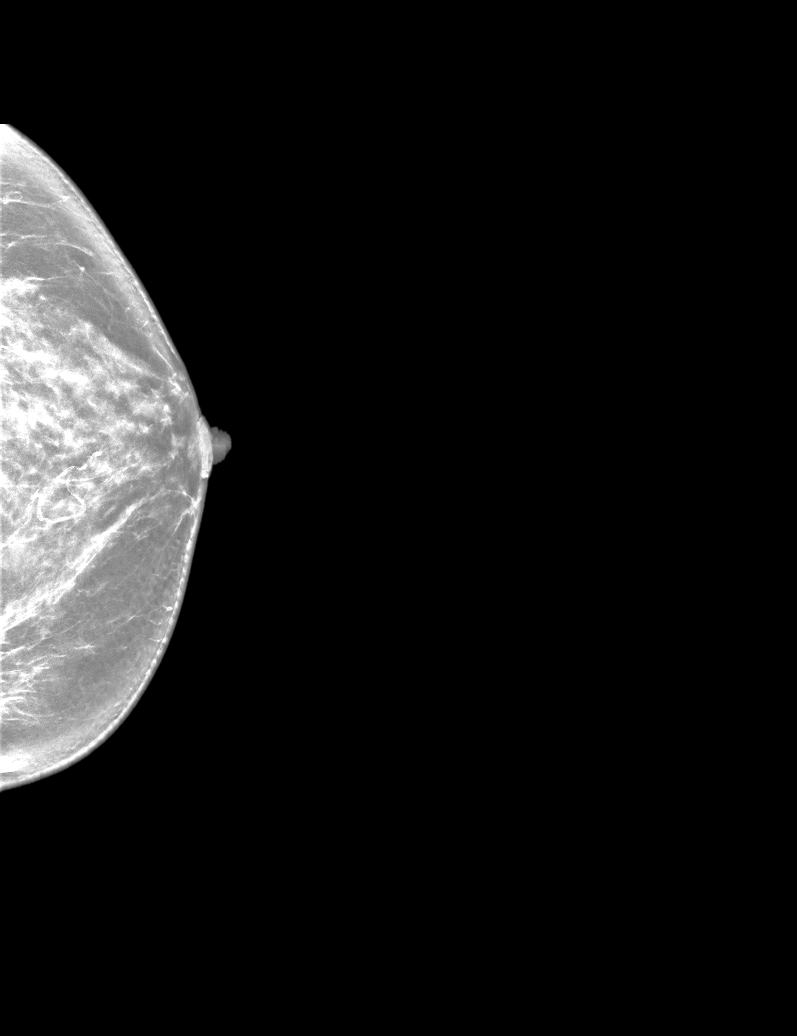

[R MLO tomo · tomo slice 31/62.0]
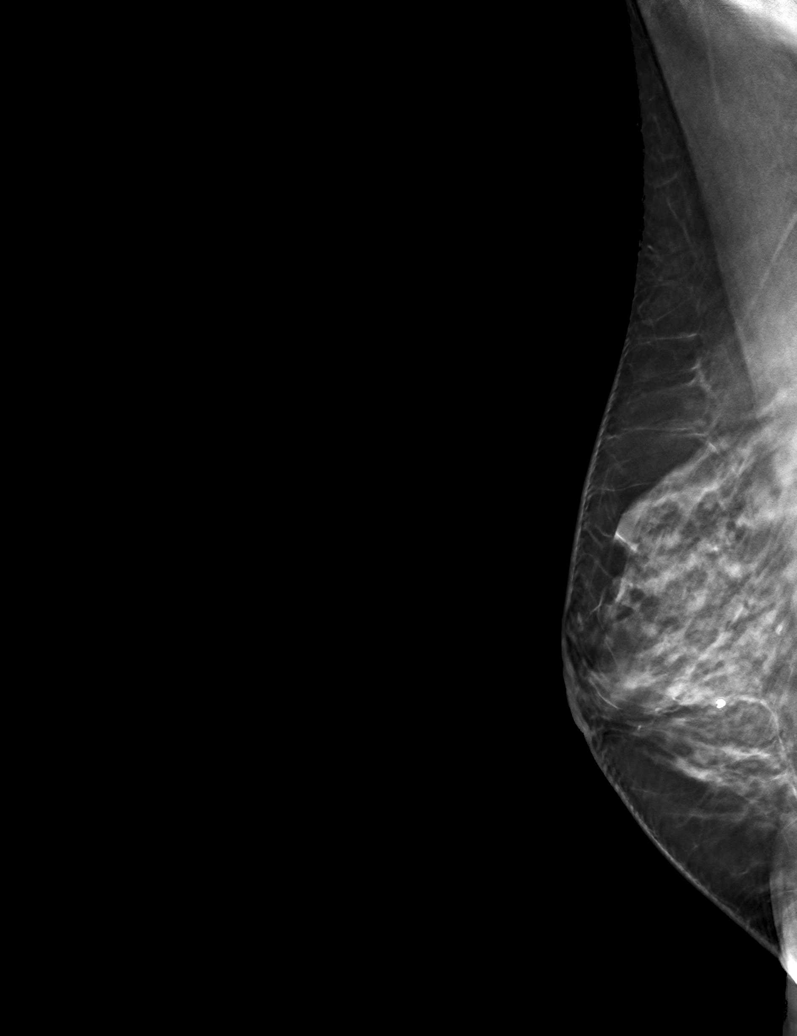

[6 of 30 positions shown; findings below may reference images not displayed]

ACR Breast Density Category c: The breast tissue is heterogeneously
dense, which may obscure small masses.
FINDINGS: There are no findings suspicious for malignancy. Images were
processed with CAD.
IMPRESSION: No mammographic evidence of malignancy. A result letter of this
screening mammogram will be mailed directly to the patient.

RECOMMENDATION:
Screening mammogram in one year. (Code:[id number])

BI-RADS CATEGORY  1: Negative.

## 2020-09-17 DIAGNOSIS — R1310 Dysphagia, unspecified: Secondary | ICD-10-CM | POA: Diagnosis not present

## 2020-09-17 DIAGNOSIS — G43909 Migraine, unspecified, not intractable, without status migrainosus: Secondary | ICD-10-CM | POA: Diagnosis not present

## 2020-09-17 DIAGNOSIS — E785 Hyperlipidemia, unspecified: Secondary | ICD-10-CM | POA: Diagnosis not present

## 2020-09-17 DIAGNOSIS — D509 Iron deficiency anemia, unspecified: Secondary | ICD-10-CM | POA: Diagnosis not present

## 2020-09-17 DIAGNOSIS — R7301 Impaired fasting glucose: Secondary | ICD-10-CM | POA: Diagnosis not present

## 2020-09-17 DIAGNOSIS — J449 Chronic obstructive pulmonary disease, unspecified: Secondary | ICD-10-CM | POA: Diagnosis not present

## 2020-09-17 DIAGNOSIS — I700 Atherosclerosis of aorta: Secondary | ICD-10-CM | POA: Diagnosis not present

## 2020-09-17 DIAGNOSIS — K8689 Other specified diseases of pancreas: Secondary | ICD-10-CM | POA: Diagnosis not present

## 2020-09-17 DIAGNOSIS — E039 Hypothyroidism, unspecified: Secondary | ICD-10-CM | POA: Diagnosis not present

## 2020-09-17 DIAGNOSIS — M810 Age-related osteoporosis without current pathological fracture: Secondary | ICD-10-CM | POA: Diagnosis not present

## 2020-09-17 DIAGNOSIS — F17210 Nicotine dependence, cigarettes, uncomplicated: Secondary | ICD-10-CM | POA: Diagnosis not present

## 2020-09-20 DIAGNOSIS — N390 Urinary tract infection, site not specified: Secondary | ICD-10-CM | POA: Diagnosis not present

## 2020-09-23 DIAGNOSIS — R319 Hematuria, unspecified: Secondary | ICD-10-CM | POA: Diagnosis not present

## 2020-09-23 DIAGNOSIS — N3090 Cystitis, unspecified without hematuria: Secondary | ICD-10-CM | POA: Diagnosis not present

## 2020-11-20 DIAGNOSIS — G894 Chronic pain syndrome: Secondary | ICD-10-CM | POA: Diagnosis not present

## 2020-11-20 DIAGNOSIS — E7800 Pure hypercholesterolemia, unspecified: Secondary | ICD-10-CM | POA: Diagnosis not present

## 2020-11-20 DIAGNOSIS — K219 Gastro-esophageal reflux disease without esophagitis: Secondary | ICD-10-CM | POA: Diagnosis not present

## 2020-11-20 DIAGNOSIS — M810 Age-related osteoporosis without current pathological fracture: Secondary | ICD-10-CM | POA: Diagnosis not present

## 2020-11-20 DIAGNOSIS — F5101 Primary insomnia: Secondary | ICD-10-CM | POA: Diagnosis not present

## 2020-11-20 DIAGNOSIS — F17210 Nicotine dependence, cigarettes, uncomplicated: Secondary | ICD-10-CM | POA: Diagnosis not present

## 2020-11-20 DIAGNOSIS — E039 Hypothyroidism, unspecified: Secondary | ICD-10-CM | POA: Diagnosis not present

## 2020-11-20 DIAGNOSIS — G43909 Migraine, unspecified, not intractable, without status migrainosus: Secondary | ICD-10-CM | POA: Diagnosis not present

## 2021-03-18 DIAGNOSIS — R7303 Prediabetes: Secondary | ICD-10-CM | POA: Diagnosis not present

## 2021-03-18 DIAGNOSIS — K219 Gastro-esophageal reflux disease without esophagitis: Secondary | ICD-10-CM | POA: Diagnosis not present

## 2021-03-18 DIAGNOSIS — G4700 Insomnia, unspecified: Secondary | ICD-10-CM | POA: Diagnosis not present

## 2021-03-18 DIAGNOSIS — E7800 Pure hypercholesterolemia, unspecified: Secondary | ICD-10-CM | POA: Diagnosis not present

## 2021-03-18 DIAGNOSIS — G894 Chronic pain syndrome: Secondary | ICD-10-CM | POA: Diagnosis not present

## 2021-03-18 DIAGNOSIS — E039 Hypothyroidism, unspecified: Secondary | ICD-10-CM | POA: Diagnosis not present

## 2021-03-18 DIAGNOSIS — J449 Chronic obstructive pulmonary disease, unspecified: Secondary | ICD-10-CM | POA: Diagnosis not present

## 2021-03-18 DIAGNOSIS — G43909 Migraine, unspecified, not intractable, without status migrainosus: Secondary | ICD-10-CM | POA: Diagnosis not present

## 2021-03-26 DIAGNOSIS — G43909 Migraine, unspecified, not intractable, without status migrainosus: Secondary | ICD-10-CM | POA: Diagnosis not present

## 2021-03-26 DIAGNOSIS — E039 Hypothyroidism, unspecified: Secondary | ICD-10-CM | POA: Diagnosis not present

## 2021-03-26 DIAGNOSIS — J449 Chronic obstructive pulmonary disease, unspecified: Secondary | ICD-10-CM | POA: Diagnosis not present

## 2021-03-26 DIAGNOSIS — R1310 Dysphagia, unspecified: Secondary | ICD-10-CM | POA: Diagnosis not present

## 2021-03-26 DIAGNOSIS — F17210 Nicotine dependence, cigarettes, uncomplicated: Secondary | ICD-10-CM | POA: Diagnosis not present

## 2021-03-26 DIAGNOSIS — R7301 Impaired fasting glucose: Secondary | ICD-10-CM | POA: Diagnosis not present

## 2021-03-26 DIAGNOSIS — M810 Age-related osteoporosis without current pathological fracture: Secondary | ICD-10-CM | POA: Diagnosis not present

## 2021-03-26 DIAGNOSIS — D509 Iron deficiency anemia, unspecified: Secondary | ICD-10-CM | POA: Diagnosis not present

## 2021-03-26 DIAGNOSIS — I700 Atherosclerosis of aorta: Secondary | ICD-10-CM | POA: Diagnosis not present

## 2021-06-28 DIAGNOSIS — G894 Chronic pain syndrome: Secondary | ICD-10-CM | POA: Diagnosis not present

## 2021-06-28 DIAGNOSIS — E7800 Pure hypercholesterolemia, unspecified: Secondary | ICD-10-CM | POA: Diagnosis not present

## 2021-06-28 DIAGNOSIS — R7303 Prediabetes: Secondary | ICD-10-CM | POA: Diagnosis not present

## 2021-06-28 DIAGNOSIS — K219 Gastro-esophageal reflux disease without esophagitis: Secondary | ICD-10-CM | POA: Diagnosis not present

## 2021-06-28 DIAGNOSIS — G43909 Migraine, unspecified, not intractable, without status migrainosus: Secondary | ICD-10-CM | POA: Diagnosis not present

## 2021-06-28 DIAGNOSIS — G4700 Insomnia, unspecified: Secondary | ICD-10-CM | POA: Diagnosis not present

## 2021-06-28 DIAGNOSIS — F17210 Nicotine dependence, cigarettes, uncomplicated: Secondary | ICD-10-CM | POA: Diagnosis not present

## 2021-06-28 DIAGNOSIS — J449 Chronic obstructive pulmonary disease, unspecified: Secondary | ICD-10-CM | POA: Diagnosis not present

## 2021-06-28 DIAGNOSIS — E039 Hypothyroidism, unspecified: Secondary | ICD-10-CM | POA: Diagnosis not present

## 2021-09-26 DIAGNOSIS — R1310 Dysphagia, unspecified: Secondary | ICD-10-CM | POA: Diagnosis not present

## 2021-09-26 DIAGNOSIS — E785 Hyperlipidemia, unspecified: Secondary | ICD-10-CM | POA: Diagnosis not present

## 2021-09-26 DIAGNOSIS — E039 Hypothyroidism, unspecified: Secondary | ICD-10-CM | POA: Diagnosis not present

## 2021-09-26 DIAGNOSIS — J449 Chronic obstructive pulmonary disease, unspecified: Secondary | ICD-10-CM | POA: Diagnosis not present

## 2021-09-26 DIAGNOSIS — G43909 Migraine, unspecified, not intractable, without status migrainosus: Secondary | ICD-10-CM | POA: Diagnosis not present

## 2021-09-26 DIAGNOSIS — R7301 Impaired fasting glucose: Secondary | ICD-10-CM | POA: Diagnosis not present

## 2021-09-26 DIAGNOSIS — F17210 Nicotine dependence, cigarettes, uncomplicated: Secondary | ICD-10-CM | POA: Diagnosis not present

## 2021-09-26 DIAGNOSIS — M810 Age-related osteoporosis without current pathological fracture: Secondary | ICD-10-CM | POA: Diagnosis not present

## 2021-09-26 DIAGNOSIS — D509 Iron deficiency anemia, unspecified: Secondary | ICD-10-CM | POA: Diagnosis not present

## 2021-09-27 DIAGNOSIS — G43909 Migraine, unspecified, not intractable, without status migrainosus: Secondary | ICD-10-CM | POA: Diagnosis not present

## 2021-09-27 DIAGNOSIS — K219 Gastro-esophageal reflux disease without esophagitis: Secondary | ICD-10-CM | POA: Diagnosis not present

## 2021-09-27 DIAGNOSIS — G894 Chronic pain syndrome: Secondary | ICD-10-CM | POA: Diagnosis not present

## 2021-09-27 DIAGNOSIS — J449 Chronic obstructive pulmonary disease, unspecified: Secondary | ICD-10-CM | POA: Diagnosis not present

## 2021-09-27 DIAGNOSIS — F17210 Nicotine dependence, cigarettes, uncomplicated: Secondary | ICD-10-CM | POA: Diagnosis not present

## 2021-09-27 DIAGNOSIS — E039 Hypothyroidism, unspecified: Secondary | ICD-10-CM | POA: Diagnosis not present

## 2021-09-27 DIAGNOSIS — E7800 Pure hypercholesterolemia, unspecified: Secondary | ICD-10-CM | POA: Diagnosis not present

## 2021-09-27 DIAGNOSIS — R7303 Prediabetes: Secondary | ICD-10-CM | POA: Diagnosis not present

## 2021-09-27 DIAGNOSIS — G4700 Insomnia, unspecified: Secondary | ICD-10-CM | POA: Diagnosis not present

## 2021-12-26 DIAGNOSIS — R7303 Prediabetes: Secondary | ICD-10-CM | POA: Diagnosis not present

## 2021-12-26 DIAGNOSIS — E7800 Pure hypercholesterolemia, unspecified: Secondary | ICD-10-CM | POA: Diagnosis not present

## 2021-12-26 DIAGNOSIS — R059 Cough, unspecified: Secondary | ICD-10-CM | POA: Diagnosis not present

## 2021-12-26 DIAGNOSIS — F17210 Nicotine dependence, cigarettes, uncomplicated: Secondary | ICD-10-CM | POA: Diagnosis not present

## 2021-12-26 DIAGNOSIS — J449 Chronic obstructive pulmonary disease, unspecified: Secondary | ICD-10-CM | POA: Diagnosis not present

## 2021-12-26 DIAGNOSIS — G43909 Migraine, unspecified, not intractable, without status migrainosus: Secondary | ICD-10-CM | POA: Diagnosis not present

## 2021-12-26 DIAGNOSIS — G4700 Insomnia, unspecified: Secondary | ICD-10-CM | POA: Diagnosis not present

## 2021-12-26 DIAGNOSIS — G894 Chronic pain syndrome: Secondary | ICD-10-CM | POA: Diagnosis not present

## 2022-04-07 DIAGNOSIS — D509 Iron deficiency anemia, unspecified: Secondary | ICD-10-CM | POA: Diagnosis not present

## 2022-04-07 DIAGNOSIS — F17210 Nicotine dependence, cigarettes, uncomplicated: Secondary | ICD-10-CM | POA: Diagnosis not present

## 2022-04-07 DIAGNOSIS — E039 Hypothyroidism, unspecified: Secondary | ICD-10-CM | POA: Diagnosis not present

## 2022-04-07 DIAGNOSIS — R7301 Impaired fasting glucose: Secondary | ICD-10-CM | POA: Diagnosis not present

## 2022-04-07 DIAGNOSIS — G43909 Migraine, unspecified, not intractable, without status migrainosus: Secondary | ICD-10-CM | POA: Diagnosis not present

## 2022-04-07 DIAGNOSIS — J449 Chronic obstructive pulmonary disease, unspecified: Secondary | ICD-10-CM | POA: Diagnosis not present

## 2022-04-07 DIAGNOSIS — M810 Age-related osteoporosis without current pathological fracture: Secondary | ICD-10-CM | POA: Diagnosis not present

## 2022-05-07 ENCOUNTER — Other Ambulatory Visit: Payer: Self-pay | Admitting: Family Medicine

## 2022-05-07 DIAGNOSIS — J449 Chronic obstructive pulmonary disease, unspecified: Secondary | ICD-10-CM | POA: Diagnosis not present

## 2022-05-07 DIAGNOSIS — Z1231 Encounter for screening mammogram for malignant neoplasm of breast: Secondary | ICD-10-CM

## 2022-05-07 DIAGNOSIS — F17210 Nicotine dependence, cigarettes, uncomplicated: Secondary | ICD-10-CM | POA: Diagnosis not present

## 2022-05-07 DIAGNOSIS — E039 Hypothyroidism, unspecified: Secondary | ICD-10-CM | POA: Diagnosis not present

## 2022-05-07 DIAGNOSIS — E7800 Pure hypercholesterolemia, unspecified: Secondary | ICD-10-CM | POA: Diagnosis not present

## 2022-05-07 DIAGNOSIS — R7303 Prediabetes: Secondary | ICD-10-CM | POA: Diagnosis not present

## 2022-05-07 DIAGNOSIS — K219 Gastro-esophageal reflux disease without esophagitis: Secondary | ICD-10-CM | POA: Diagnosis not present

## 2022-05-07 DIAGNOSIS — G894 Chronic pain syndrome: Secondary | ICD-10-CM | POA: Diagnosis not present

## 2022-05-07 DIAGNOSIS — G4700 Insomnia, unspecified: Secondary | ICD-10-CM | POA: Diagnosis not present

## 2022-05-16 ENCOUNTER — Ambulatory Visit
Admission: RE | Admit: 2022-05-16 | Discharge: 2022-05-16 | Disposition: A | Discharge status: Home / Self Care | Payer: Medicare Other | Source: Ambulatory Visit | Attending: Family Medicine | Admitting: Family Medicine

## 2022-05-16 DIAGNOSIS — Z1231 Encounter for screening mammogram for malignant neoplasm of breast: Secondary | ICD-10-CM

## 2022-10-03 DIAGNOSIS — Z13828 Encounter for screening for other musculoskeletal disorder: Secondary | ICD-10-CM | POA: Diagnosis not present

## 2022-10-03 DIAGNOSIS — R7301 Impaired fasting glucose: Secondary | ICD-10-CM | POA: Diagnosis not present

## 2022-10-03 DIAGNOSIS — F17210 Nicotine dependence, cigarettes, uncomplicated: Secondary | ICD-10-CM | POA: Diagnosis not present

## 2022-10-03 DIAGNOSIS — E039 Hypothyroidism, unspecified: Secondary | ICD-10-CM | POA: Diagnosis not present

## 2022-10-03 DIAGNOSIS — K8689 Other specified diseases of pancreas: Secondary | ICD-10-CM | POA: Diagnosis not present

## 2022-10-03 DIAGNOSIS — D509 Iron deficiency anemia, unspecified: Secondary | ICD-10-CM | POA: Diagnosis not present

## 2022-10-03 DIAGNOSIS — G43909 Migraine, unspecified, not intractable, without status migrainosus: Secondary | ICD-10-CM | POA: Diagnosis not present

## 2022-10-03 DIAGNOSIS — E785 Hyperlipidemia, unspecified: Secondary | ICD-10-CM | POA: Diagnosis not present

## 2022-10-03 DIAGNOSIS — R1310 Dysphagia, unspecified: Secondary | ICD-10-CM | POA: Diagnosis not present

## 2022-10-03 DIAGNOSIS — M810 Age-related osteoporosis without current pathological fracture: Secondary | ICD-10-CM | POA: Diagnosis not present

## 2022-10-03 DIAGNOSIS — J449 Chronic obstructive pulmonary disease, unspecified: Secondary | ICD-10-CM | POA: Diagnosis not present

## 2022-10-06 DIAGNOSIS — G894 Chronic pain syndrome: Secondary | ICD-10-CM | POA: Diagnosis not present

## 2022-10-06 DIAGNOSIS — J449 Chronic obstructive pulmonary disease, unspecified: Secondary | ICD-10-CM | POA: Diagnosis not present

## 2022-10-06 DIAGNOSIS — E7800 Pure hypercholesterolemia, unspecified: Secondary | ICD-10-CM | POA: Diagnosis not present

## 2022-10-06 DIAGNOSIS — E039 Hypothyroidism, unspecified: Secondary | ICD-10-CM | POA: Diagnosis not present

## 2022-10-06 DIAGNOSIS — F17210 Nicotine dependence, cigarettes, uncomplicated: Secondary | ICD-10-CM | POA: Diagnosis not present

## 2022-10-06 DIAGNOSIS — R7303 Prediabetes: Secondary | ICD-10-CM | POA: Diagnosis not present

## 2022-10-06 DIAGNOSIS — G4700 Insomnia, unspecified: Secondary | ICD-10-CM | POA: Diagnosis not present

## 2022-10-06 DIAGNOSIS — K219 Gastro-esophageal reflux disease without esophagitis: Secondary | ICD-10-CM | POA: Diagnosis not present

## 2023-01-06 DIAGNOSIS — F17210 Nicotine dependence, cigarettes, uncomplicated: Secondary | ICD-10-CM | POA: Diagnosis not present

## 2023-01-06 DIAGNOSIS — R7303 Prediabetes: Secondary | ICD-10-CM | POA: Diagnosis not present

## 2023-01-06 DIAGNOSIS — E039 Hypothyroidism, unspecified: Secondary | ICD-10-CM | POA: Diagnosis not present

## 2023-01-06 DIAGNOSIS — G43909 Migraine, unspecified, not intractable, without status migrainosus: Secondary | ICD-10-CM | POA: Diagnosis not present

## 2023-01-06 DIAGNOSIS — K219 Gastro-esophageal reflux disease without esophagitis: Secondary | ICD-10-CM | POA: Diagnosis not present

## 2023-01-06 DIAGNOSIS — G894 Chronic pain syndrome: Secondary | ICD-10-CM | POA: Diagnosis not present

## 2023-01-06 DIAGNOSIS — G4700 Insomnia, unspecified: Secondary | ICD-10-CM | POA: Diagnosis not present

## 2023-01-06 DIAGNOSIS — E7800 Pure hypercholesterolemia, unspecified: Secondary | ICD-10-CM | POA: Diagnosis not present

## 2023-03-03 DIAGNOSIS — Z1211 Encounter for screening for malignant neoplasm of colon: Secondary | ICD-10-CM | POA: Diagnosis not present

## 2023-03-03 DIAGNOSIS — K644 Residual hemorrhoidal skin tags: Secondary | ICD-10-CM | POA: Diagnosis not present

## 2023-03-03 DIAGNOSIS — K640 First degree hemorrhoids: Secondary | ICD-10-CM | POA: Diagnosis not present

## 2023-04-07 DIAGNOSIS — G894 Chronic pain syndrome: Secondary | ICD-10-CM | POA: Diagnosis not present

## 2023-04-07 DIAGNOSIS — G43909 Migraine, unspecified, not intractable, without status migrainosus: Secondary | ICD-10-CM | POA: Diagnosis not present

## 2023-04-07 DIAGNOSIS — G4700 Insomnia, unspecified: Secondary | ICD-10-CM | POA: Diagnosis not present

## 2023-04-07 DIAGNOSIS — E7800 Pure hypercholesterolemia, unspecified: Secondary | ICD-10-CM | POA: Diagnosis not present

## 2023-04-07 DIAGNOSIS — K219 Gastro-esophageal reflux disease without esophagitis: Secondary | ICD-10-CM | POA: Diagnosis not present

## 2023-04-07 DIAGNOSIS — E039 Hypothyroidism, unspecified: Secondary | ICD-10-CM | POA: Diagnosis not present

## 2023-04-07 DIAGNOSIS — F17210 Nicotine dependence, cigarettes, uncomplicated: Secondary | ICD-10-CM | POA: Diagnosis not present

## 2023-04-07 DIAGNOSIS — R7303 Prediabetes: Secondary | ICD-10-CM | POA: Diagnosis not present

## 2023-04-15 DIAGNOSIS — I700 Atherosclerosis of aorta: Secondary | ICD-10-CM | POA: Diagnosis not present

## 2023-04-15 DIAGNOSIS — M810 Age-related osteoporosis without current pathological fracture: Secondary | ICD-10-CM | POA: Diagnosis not present

## 2023-04-15 DIAGNOSIS — J449 Chronic obstructive pulmonary disease, unspecified: Secondary | ICD-10-CM | POA: Diagnosis not present

## 2023-04-15 DIAGNOSIS — R1031 Right lower quadrant pain: Secondary | ICD-10-CM | POA: Diagnosis not present

## 2023-04-15 DIAGNOSIS — D509 Iron deficiency anemia, unspecified: Secondary | ICD-10-CM | POA: Diagnosis not present

## 2023-04-15 DIAGNOSIS — G43909 Migraine, unspecified, not intractable, without status migrainosus: Secondary | ICD-10-CM | POA: Diagnosis not present

## 2023-04-15 DIAGNOSIS — E039 Hypothyroidism, unspecified: Secondary | ICD-10-CM | POA: Diagnosis not present

## 2023-04-15 DIAGNOSIS — E785 Hyperlipidemia, unspecified: Secondary | ICD-10-CM | POA: Diagnosis not present

## 2023-04-15 DIAGNOSIS — R7301 Impaired fasting glucose: Secondary | ICD-10-CM | POA: Diagnosis not present

## 2023-04-15 DIAGNOSIS — F17210 Nicotine dependence, cigarettes, uncomplicated: Secondary | ICD-10-CM | POA: Diagnosis not present

## 2023-04-20 DIAGNOSIS — R109 Unspecified abdominal pain: Secondary | ICD-10-CM | POA: Diagnosis not present

## 2023-04-20 DIAGNOSIS — R404 Transient alteration of awareness: Secondary | ICD-10-CM | POA: Diagnosis not present

## 2023-04-20 DIAGNOSIS — R6889 Other general symptoms and signs: Secondary | ICD-10-CM | POA: Diagnosis not present

## 2023-04-20 DIAGNOSIS — K5903 Drug induced constipation: Secondary | ICD-10-CM | POA: Diagnosis not present

## 2023-04-20 DIAGNOSIS — K8689 Other specified diseases of pancreas: Secondary | ICD-10-CM | POA: Diagnosis not present

## 2023-04-20 DIAGNOSIS — K449 Diaphragmatic hernia without obstruction or gangrene: Secondary | ICD-10-CM | POA: Diagnosis not present

## 2023-04-20 DIAGNOSIS — Z743 Need for continuous supervision: Secondary | ICD-10-CM | POA: Diagnosis not present

## 2023-04-20 DIAGNOSIS — I499 Cardiac arrhythmia, unspecified: Secondary | ICD-10-CM | POA: Diagnosis not present

## 2023-04-20 DIAGNOSIS — R55 Syncope and collapse: Secondary | ICD-10-CM | POA: Diagnosis not present

## 2023-04-20 DIAGNOSIS — R42 Dizziness and giddiness: Secondary | ICD-10-CM | POA: Diagnosis not present

## 2023-05-28 ENCOUNTER — Other Ambulatory Visit: Payer: Self-pay

## 2023-05-28 DIAGNOSIS — F17210 Nicotine dependence, cigarettes, uncomplicated: Secondary | ICD-10-CM

## 2023-05-28 DIAGNOSIS — Z87891 Personal history of nicotine dependence: Secondary | ICD-10-CM

## 2023-05-28 DIAGNOSIS — Z122 Encounter for screening for malignant neoplasm of respiratory organs: Secondary | ICD-10-CM

## 2023-06-03 ENCOUNTER — Other Ambulatory Visit: Payer: Self-pay | Admitting: Family Medicine

## 2023-06-03 DIAGNOSIS — Z1231 Encounter for screening mammogram for malignant neoplasm of breast: Secondary | ICD-10-CM

## 2023-06-05 ENCOUNTER — Ambulatory Visit: Payer: Medicare Other | Admitting: Acute Care

## 2023-06-05 DIAGNOSIS — F17210 Nicotine dependence, cigarettes, uncomplicated: Secondary | ICD-10-CM

## 2023-06-05 NOTE — Progress Notes (Addendum)
 Provider Attestation I agree with the documentation of the Shared Decision Making visit,  smoking cessation counseling if appropriate, and verification or eligibility for lung cancer screening as documented by the RN Nurse Navigator.   Lauraine PHEBE Lites, MSN, AGACNP-BC [facility] See Amion for personal pager PCCM on call pager [phone]     Virtual Visit via Telephone Note  I connected with Kelli Vasquez on 06/05/23 at 10:30 AM EDT by telephone and verified that I am speaking with the correct person using two identifiers.  Location: Patient: Kelli Vasquez Provider: Laneta Speaks, RN   I discussed the limitations, risks, security and privacy concerns of performing an evaluation and management service by telephone and the availability of in person appointments. I also discussed with the patient that there may be a patient responsible charge related to this service. The patient expressed understanding and agreed to proceed.     Laneta Speaks, RN   Shared Decision Making Visit Lung Cancer Screening Program [phone])   Eligibility: Age 67 y.o. Pack Years Smoking History Calculation 15 (# packs/per year x # years smoked) Recent History of coughing up blood  no Unexplained weight loss? no ( >Than 15 pounds within the last 6 months ) Prior History Lung / other cancer no (Diagnosis within the last 5 years already requiring surveillance chest CT Scans). Smoking Status Current Smoker  Visit Components: Discussion included one or more decision making aids. yes Discussion included risk/benefits of screening. yes Discussion included potential follow up diagnostic testing for abnormal scans. yes Discussion included meaning and risk of over diagnosis. yes Discussion included meaning and risk of False Positives. yes Discussion included meaning of total radiation exposure. yes  Counseling Included: Importance of adherence to annual lung cancer  LDCT screening. yes Impact of comorbidities on ability to participate in the program. yes Ability and willingness to under diagnostic treatment. yes  Smoking Cessation Counseling: Current Smokers:  Discussed importance of smoking cessation. yes Information about tobacco cessation classes and interventions provided to patient. yes Patient provided with ticket for LDCT Scan. yes Symptomatic Patient. no  Counseling(Intermediate counseling: > three minutes) 99406 Diagnosis Code: Tobacco Use Z72.0 Asymptomatic Patient no  Counseling (Intermediate counseling: > three minutes counseling) [id number] Former Smokers:  Discussed the importance of maintaining cigarette abstinence. yes Diagnosis Code: Personal History of Nicotine Dependence. S12.108 Information about tobacco cessation classes and interventions provided to patient. Yes Patient provided with ticket for LDCT Scan. yes Written Order for Lung Cancer Screening with LDCT placed in Epic. Yes (CT Chest Lung Cancer Screening Low Dose W/O CM) [id number] Z12.2-Screening of respiratory organs Z87.891-Personal history of nicotine dependence   Laneta Speaks, RN

## 2023-06-05 NOTE — Patient Instructions (Signed)

## 2023-06-08 ENCOUNTER — Ambulatory Visit
Admission: RE | Admit: 2023-06-08 | Discharge: 2023-06-08 | Disposition: A | Discharge status: Home / Self Care | Payer: Medicare Other | Source: Ambulatory Visit | Attending: Family Medicine | Admitting: Family Medicine

## 2023-06-08 DIAGNOSIS — F17210 Nicotine dependence, cigarettes, uncomplicated: Secondary | ICD-10-CM

## 2023-06-08 DIAGNOSIS — Z122 Encounter for screening for malignant neoplasm of respiratory organs: Secondary | ICD-10-CM

## 2023-06-08 DIAGNOSIS — Z1231 Encounter for screening mammogram for malignant neoplasm of breast: Secondary | ICD-10-CM

## 2023-06-08 DIAGNOSIS — Z87891 Personal history of nicotine dependence: Secondary | ICD-10-CM | POA: Diagnosis not present

## 2023-06-19 ENCOUNTER — Other Ambulatory Visit: Payer: Self-pay | Admitting: Acute Care

## 2023-06-19 DIAGNOSIS — Z122 Encounter for screening for malignant neoplasm of respiratory organs: Secondary | ICD-10-CM

## 2023-06-19 DIAGNOSIS — Z87891 Personal history of nicotine dependence: Secondary | ICD-10-CM

## 2023-06-19 DIAGNOSIS — F17210 Nicotine dependence, cigarettes, uncomplicated: Secondary | ICD-10-CM

## 2023-07-06 ENCOUNTER — Emergency Department (HOSPITAL_COMMUNITY)
Admission: EM | Admit: 2023-07-06 | Discharge: 2023-07-06 | Discharge status: Against Medical Advice | Payer: Medicare Other | Attending: Emergency Medicine | Admitting: Emergency Medicine

## 2023-07-06 ENCOUNTER — Encounter (HOSPITAL_COMMUNITY): Payer: Medicare Other

## 2023-07-06 ENCOUNTER — Other Ambulatory Visit: Payer: Self-pay

## 2023-07-06 ENCOUNTER — Encounter (HOSPITAL_COMMUNITY): Payer: Self-pay

## 2023-07-06 DIAGNOSIS — Z5321 Procedure and treatment not carried out due to patient leaving prior to being seen by health care provider: Secondary | ICD-10-CM | POA: Diagnosis not present

## 2023-07-06 DIAGNOSIS — M79604 Pain in right leg: Secondary | ICD-10-CM | POA: Diagnosis not present

## 2023-07-06 NOTE — ED Triage Notes (Signed)
Patient is here for evaluation of pain behind left knee X a few weeks. States that the pain recently shifted and it is now in front of her knee and up into her thigh. Pt concerned about a possible blood clot.

## 2023-07-06 NOTE — ED Provider Triage Note (Signed)
Emergency Medicine Provider Triage Evaluation Note  Kelli Vasquez , a 67 y.o. female  was evaluated in triage.  Pt complains of right leg pain.  Review of Systems  Positive: Pain right leg Negative: SOB, chest pain, swelling  Physical Exam  BP (!) 143/68 (BP Location: Right Arm)   Pulse 97   Temp 98.4 F (36.9 C) (Oral)   Resp 16   Ht 5\' 7"  (1.702 m)   Wt 55.3 kg   SpO2 97%   BMI 19.09 kg/m  Gen:   Awake, no distress   Resp:  Normal effort  MSK:   Moves extremities without difficulty Left LE tender posterior knee and anteromedial distal thigh. Distal pulses intact Other:    Medical Decision Making  Medically screening exam initiated at 3:09 PM.  Appropriate orders placed.  Kelli Vasquez was informed that the remainder of the evaluation will be completed by another provider, this initial triage assessment does not replace that evaluation, and the importance of remaining in the ED until their evaluation is complete.  Progressively worsening pain in the left leg over a week. No swelling. No injury. No weakness.  21:56: Vascular study was ordered, however, patient left prior to being seen without informing staff.    Kelli Anis, PA-C 07/06/23 2156

## 2023-07-10 DIAGNOSIS — K219 Gastro-esophageal reflux disease without esophagitis: Secondary | ICD-10-CM | POA: Diagnosis not present

## 2023-07-10 DIAGNOSIS — F17210 Nicotine dependence, cigarettes, uncomplicated: Secondary | ICD-10-CM | POA: Diagnosis not present

## 2023-07-10 DIAGNOSIS — G43909 Migraine, unspecified, not intractable, without status migrainosus: Secondary | ICD-10-CM | POA: Diagnosis not present

## 2023-07-10 DIAGNOSIS — G4700 Insomnia, unspecified: Secondary | ICD-10-CM | POA: Diagnosis not present

## 2023-07-10 DIAGNOSIS — G894 Chronic pain syndrome: Secondary | ICD-10-CM | POA: Diagnosis not present

## 2023-07-10 DIAGNOSIS — E7800 Pure hypercholesterolemia, unspecified: Secondary | ICD-10-CM | POA: Diagnosis not present

## 2023-07-10 DIAGNOSIS — M79605 Pain in left leg: Secondary | ICD-10-CM | POA: Diagnosis not present

## 2023-07-10 DIAGNOSIS — R7303 Prediabetes: Secondary | ICD-10-CM | POA: Diagnosis not present

## 2023-07-10 DIAGNOSIS — E039 Hypothyroidism, unspecified: Secondary | ICD-10-CM | POA: Diagnosis not present

## 2023-09-01 DIAGNOSIS — J441 Chronic obstructive pulmonary disease with (acute) exacerbation: Secondary | ICD-10-CM | POA: Diagnosis not present

## 2023-10-07 DIAGNOSIS — D509 Iron deficiency anemia, unspecified: Secondary | ICD-10-CM | POA: Diagnosis not present

## 2023-10-07 DIAGNOSIS — E039 Hypothyroidism, unspecified: Secondary | ICD-10-CM | POA: Diagnosis not present

## 2023-10-07 DIAGNOSIS — I700 Atherosclerosis of aorta: Secondary | ICD-10-CM | POA: Diagnosis not present

## 2023-10-07 DIAGNOSIS — M810 Age-related osteoporosis without current pathological fracture: Secondary | ICD-10-CM | POA: Diagnosis not present

## 2023-10-07 DIAGNOSIS — R1310 Dysphagia, unspecified: Secondary | ICD-10-CM | POA: Diagnosis not present

## 2023-10-07 DIAGNOSIS — F17210 Nicotine dependence, cigarettes, uncomplicated: Secondary | ICD-10-CM | POA: Diagnosis not present

## 2023-10-07 DIAGNOSIS — R7301 Impaired fasting glucose: Secondary | ICD-10-CM | POA: Diagnosis not present

## 2023-10-07 DIAGNOSIS — G43909 Migraine, unspecified, not intractable, without status migrainosus: Secondary | ICD-10-CM | POA: Diagnosis not present

## 2023-10-07 DIAGNOSIS — J449 Chronic obstructive pulmonary disease, unspecified: Secondary | ICD-10-CM | POA: Diagnosis not present

## 2023-10-07 DIAGNOSIS — E785 Hyperlipidemia, unspecified: Secondary | ICD-10-CM | POA: Diagnosis not present

## 2023-10-29 DIAGNOSIS — Z0000 Encounter for general adult medical examination without abnormal findings: Secondary | ICD-10-CM | POA: Diagnosis not present

## 2023-10-29 DIAGNOSIS — F17210 Nicotine dependence, cigarettes, uncomplicated: Secondary | ICD-10-CM | POA: Diagnosis not present

## 2023-10-29 DIAGNOSIS — E7800 Pure hypercholesterolemia, unspecified: Secondary | ICD-10-CM | POA: Diagnosis not present

## 2023-10-29 DIAGNOSIS — G894 Chronic pain syndrome: Secondary | ICD-10-CM | POA: Diagnosis not present

## 2023-10-29 DIAGNOSIS — G4700 Insomnia, unspecified: Secondary | ICD-10-CM | POA: Diagnosis not present

## 2023-10-29 DIAGNOSIS — E039 Hypothyroidism, unspecified: Secondary | ICD-10-CM | POA: Diagnosis not present

## 2023-10-29 DIAGNOSIS — G43909 Migraine, unspecified, not intractable, without status migrainosus: Secondary | ICD-10-CM | POA: Diagnosis not present

## 2023-10-29 DIAGNOSIS — K219 Gastro-esophageal reflux disease without esophagitis: Secondary | ICD-10-CM | POA: Diagnosis not present

## 2023-10-29 DIAGNOSIS — R7303 Prediabetes: Secondary | ICD-10-CM | POA: Diagnosis not present

## 2023-12-12 DIAGNOSIS — G8929 Other chronic pain: Secondary | ICD-10-CM | POA: Diagnosis not present

## 2023-12-12 DIAGNOSIS — M5442 Lumbago with sciatica, left side: Secondary | ICD-10-CM | POA: Diagnosis not present

## 2023-12-12 DIAGNOSIS — M4807 Spinal stenosis, lumbosacral region: Secondary | ICD-10-CM | POA: Diagnosis not present

## 2023-12-12 DIAGNOSIS — M5126 Other intervertebral disc displacement, lumbar region: Secondary | ICD-10-CM | POA: Diagnosis not present

## 2023-12-12 DIAGNOSIS — M79605 Pain in left leg: Secondary | ICD-10-CM | POA: Diagnosis not present

## 2023-12-12 DIAGNOSIS — M47817 Spondylosis without myelopathy or radiculopathy, lumbosacral region: Secondary | ICD-10-CM | POA: Diagnosis not present

## 2023-12-15 DIAGNOSIS — M5440 Lumbago with sciatica, unspecified side: Secondary | ICD-10-CM | POA: Diagnosis not present

## 2023-12-17 DIAGNOSIS — M5416 Radiculopathy, lumbar region: Secondary | ICD-10-CM | POA: Diagnosis not present

## 2024-01-06 DIAGNOSIS — M5416 Radiculopathy, lumbar region: Secondary | ICD-10-CM | POA: Diagnosis not present

## 2024-02-02 DIAGNOSIS — R7303 Prediabetes: Secondary | ICD-10-CM | POA: Diagnosis not present

## 2024-02-02 DIAGNOSIS — G43909 Migraine, unspecified, not intractable, without status migrainosus: Secondary | ICD-10-CM | POA: Diagnosis not present

## 2024-02-02 DIAGNOSIS — G894 Chronic pain syndrome: Secondary | ICD-10-CM | POA: Diagnosis not present

## 2024-02-02 DIAGNOSIS — K219 Gastro-esophageal reflux disease without esophagitis: Secondary | ICD-10-CM | POA: Diagnosis not present

## 2024-02-02 DIAGNOSIS — F17210 Nicotine dependence, cigarettes, uncomplicated: Secondary | ICD-10-CM | POA: Diagnosis not present

## 2024-02-02 DIAGNOSIS — G4700 Insomnia, unspecified: Secondary | ICD-10-CM | POA: Diagnosis not present

## 2024-02-02 DIAGNOSIS — E7800 Pure hypercholesterolemia, unspecified: Secondary | ICD-10-CM | POA: Diagnosis not present

## 2024-02-02 DIAGNOSIS — E039 Hypothyroidism, unspecified: Secondary | ICD-10-CM | POA: Diagnosis not present

## 2024-03-16 ENCOUNTER — Encounter: Payer: Self-pay | Admitting: Acute Care

## 2024-04-07 DIAGNOSIS — G43909 Migraine, unspecified, not intractable, without status migrainosus: Secondary | ICD-10-CM | POA: Diagnosis not present

## 2024-04-07 DIAGNOSIS — M810 Age-related osteoporosis without current pathological fracture: Secondary | ICD-10-CM | POA: Diagnosis not present

## 2024-04-07 DIAGNOSIS — R1310 Dysphagia, unspecified: Secondary | ICD-10-CM | POA: Diagnosis not present

## 2024-04-07 DIAGNOSIS — J449 Chronic obstructive pulmonary disease, unspecified: Secondary | ICD-10-CM | POA: Diagnosis not present

## 2024-04-07 DIAGNOSIS — R7301 Impaired fasting glucose: Secondary | ICD-10-CM | POA: Diagnosis not present

## 2024-04-07 DIAGNOSIS — F17210 Nicotine dependence, cigarettes, uncomplicated: Secondary | ICD-10-CM | POA: Diagnosis not present

## 2024-04-07 DIAGNOSIS — I700 Atherosclerosis of aorta: Secondary | ICD-10-CM | POA: Diagnosis not present

## 2024-04-07 DIAGNOSIS — M5416 Radiculopathy, lumbar region: Secondary | ICD-10-CM | POA: Diagnosis not present

## 2024-05-10 DIAGNOSIS — G43909 Migraine, unspecified, not intractable, without status migrainosus: Secondary | ICD-10-CM | POA: Diagnosis not present

## 2024-05-10 DIAGNOSIS — K219 Gastro-esophageal reflux disease without esophagitis: Secondary | ICD-10-CM | POA: Diagnosis not present

## 2024-05-10 DIAGNOSIS — E039 Hypothyroidism, unspecified: Secondary | ICD-10-CM | POA: Diagnosis not present

## 2024-05-10 DIAGNOSIS — J40 Bronchitis, not specified as acute or chronic: Secondary | ICD-10-CM | POA: Diagnosis not present

## 2024-05-10 DIAGNOSIS — G894 Chronic pain syndrome: Secondary | ICD-10-CM | POA: Diagnosis not present

## 2024-05-10 DIAGNOSIS — E7800 Pure hypercholesterolemia, unspecified: Secondary | ICD-10-CM | POA: Diagnosis not present

## 2024-06-06 DIAGNOSIS — E039 Hypothyroidism, unspecified: Secondary | ICD-10-CM | POA: Diagnosis not present

## 2024-08-11 ENCOUNTER — Other Ambulatory Visit: Payer: Self-pay | Admitting: Acute Care

## 2024-08-11 DIAGNOSIS — Z87891 Personal history of nicotine dependence: Secondary | ICD-10-CM

## 2024-08-11 DIAGNOSIS — F17210 Nicotine dependence, cigarettes, uncomplicated: Secondary | ICD-10-CM

## 2024-08-11 DIAGNOSIS — Z122 Encounter for screening for malignant neoplasm of respiratory organs: Secondary | ICD-10-CM

## 2024-09-05 ENCOUNTER — Ambulatory Visit

## 2024-09-08 ENCOUNTER — Other Ambulatory Visit: Payer: Self-pay | Admitting: Family Medicine

## 2024-09-08 ENCOUNTER — Inpatient Hospital Stay: Admission: RE | Admit: 2024-09-08 | Discharge: 2024-09-08 | Attending: Acute Care | Admitting: Acute Care

## 2024-09-08 DIAGNOSIS — Z122 Encounter for screening for malignant neoplasm of respiratory organs: Secondary | ICD-10-CM

## 2024-09-08 DIAGNOSIS — Z87891 Personal history of nicotine dependence: Secondary | ICD-10-CM

## 2024-09-08 DIAGNOSIS — Z1231 Encounter for screening mammogram for malignant neoplasm of breast: Secondary | ICD-10-CM

## 2024-09-08 DIAGNOSIS — F17210 Nicotine dependence, cigarettes, uncomplicated: Secondary | ICD-10-CM

## 2024-09-19 ENCOUNTER — Other Ambulatory Visit: Payer: Self-pay

## 2024-09-19 DIAGNOSIS — Z122 Encounter for screening for malignant neoplasm of respiratory organs: Secondary | ICD-10-CM

## 2024-09-19 DIAGNOSIS — Z87891 Personal history of nicotine dependence: Secondary | ICD-10-CM

## 2024-09-19 DIAGNOSIS — F17210 Nicotine dependence, cigarettes, uncomplicated: Secondary | ICD-10-CM

## 2024-09-20 ENCOUNTER — Ambulatory Visit

## 2024-09-27 ENCOUNTER — Ambulatory Visit

## 2024-09-28 ENCOUNTER — Ambulatory Visit

## 2024-10-06 ENCOUNTER — Ambulatory Visit

## 2024-10-11 ENCOUNTER — Ambulatory Visit
